# Patient Record
Sex: Female | Born: 1935 | Hispanic: No | Smoking: Never smoker
Health system: Southern US, Community
[De-identification: ages and names within clinical notes are randomized; demographics above are authoritative.]

## PROBLEM LIST (undated history)

## (undated) DIAGNOSIS — E079 Disorder of thyroid, unspecified: Secondary | ICD-10-CM

## (undated) DIAGNOSIS — I10 Essential (primary) hypertension: Secondary | ICD-10-CM

## (undated) DIAGNOSIS — M069 Rheumatoid arthritis, unspecified: Secondary | ICD-10-CM

## (undated) DIAGNOSIS — C801 Malignant (primary) neoplasm, unspecified: Secondary | ICD-10-CM

## (undated) DIAGNOSIS — M810 Age-related osteoporosis without current pathological fracture: Secondary | ICD-10-CM

## (undated) HISTORY — DX: Disorder of thyroid, unspecified: E07.9

## (undated) HISTORY — DX: Malignant (primary) neoplasm, unspecified: C80.1

## (undated) HISTORY — DX: Essential (primary) hypertension: I10

## (undated) HISTORY — DX: Age-related osteoporosis without current pathological fracture: M81.0

## (undated) HISTORY — DX: Rheumatoid arthritis, unspecified: M06.9

## (undated) HISTORY — PX: OTHER SURGICAL HISTORY: SHX169

## (undated) HISTORY — PX: ABDOMINAL HYSTERECTOMY: SHX81

---

## 2000-09-12 ENCOUNTER — Other Ambulatory Visit: Admission: RE | Admit: 2000-09-12 | Discharge: 2000-09-12 | Payer: Self-pay | Admitting: Family Medicine

## 2004-03-23 ENCOUNTER — Other Ambulatory Visit: Admission: RE | Admit: 2004-03-23 | Discharge: 2004-03-23 | Payer: Self-pay | Admitting: Family Medicine

## 2012-11-04 ENCOUNTER — Ambulatory Visit: Payer: Self-pay | Admitting: Family Medicine

## 2013-07-16 DIAGNOSIS — M069 Rheumatoid arthritis, unspecified: Secondary | ICD-10-CM

## 2013-07-16 HISTORY — DX: Rheumatoid arthritis, unspecified: M06.9

## 2013-10-06 ENCOUNTER — Ambulatory Visit (INDEPENDENT_AMBULATORY_CARE_PROVIDER_SITE_OTHER): Payer: Medicare Other | Admitting: General Practice

## 2013-10-06 ENCOUNTER — Encounter (INDEPENDENT_AMBULATORY_CARE_PROVIDER_SITE_OTHER): Payer: Self-pay

## 2013-10-06 ENCOUNTER — Encounter: Payer: Self-pay | Admitting: General Practice

## 2013-10-06 VITALS — BP 172/86 | HR 87 | Temp 97.6°F | Ht 59.5 in | Wt 124.8 lb

## 2013-10-06 DIAGNOSIS — R350 Frequency of micturition: Secondary | ICD-10-CM

## 2013-10-06 DIAGNOSIS — I10 Essential (primary) hypertension: Secondary | ICD-10-CM

## 2013-10-06 DIAGNOSIS — IMO0001 Reserved for inherently not codable concepts without codable children: Secondary | ICD-10-CM

## 2013-10-06 DIAGNOSIS — Z0000 Encounter for general adult medical examination without abnormal findings: Secondary | ICD-10-CM

## 2013-10-06 DIAGNOSIS — E039 Hypothyroidism, unspecified: Secondary | ICD-10-CM

## 2013-10-06 LAB — POCT URINALYSIS DIPSTICK
Bilirubin, UA: NEGATIVE
Blood, UA: NEGATIVE
Glucose, UA: NEGATIVE
Ketones, UA: NEGATIVE
LEUKOCYTES UA: NEGATIVE
NITRITE UA: NEGATIVE
PH UA: 6.5
Protein, UA: NEGATIVE
Spec Grav, UA: 1.01
Urobilinogen, UA: NEGATIVE

## 2013-10-06 LAB — POCT CBC
GRANULOCYTE PERCENT: 56 % (ref 37–80)
HEMATOCRIT: 41 % (ref 37.7–47.9)
Hemoglobin: 12.8 g/dL (ref 12.2–16.2)
LYMPH, POC: 2.5 (ref 0.6–3.4)
MCH, POC: 29.6 pg (ref 27–31.2)
MCHC: 31.3 g/dL — AB (ref 31.8–35.4)
MCV: 94.7 fL (ref 80–97)
MPV: 8.8 fL (ref 0–99.8)
POC Granulocyte: 3.5 (ref 2–6.9)
POC LYMPH %: 40.2 % (ref 10–50)
Platelet Count, POC: 222 10*3/uL (ref 142–424)
RBC: 4.3 M/uL (ref 4.04–5.48)
RDW, POC: 14 %
WBC: 6.2 10*3/uL (ref 4.6–10.2)

## 2013-10-06 LAB — POCT UA - MICROSCOPIC ONLY
Bacteria, U Microscopic: NEGATIVE
CASTS, UR, LPF, POC: NEGATIVE
Crystals, Ur, HPF, POC: NEGATIVE
MUCUS UA: NEGATIVE
RBC, urine, microscopic: NEGATIVE
Yeast, UA: NEGATIVE

## 2013-10-06 MED ORDER — LISINOPRIL 10 MG PO TABS
10.0000 mg | ORAL_TABLET | Freq: Every day | ORAL | Status: DC
Start: 2013-10-06 — End: 2013-10-14

## 2013-10-06 NOTE — Progress Notes (Signed)
   Subjective:    Patient ID: Ashley Nelson, female    DOB: 11-May-1933, 80 y.o.   MRN: [id number]  Urinary Frequency  This is a new problem. The current episode started in the past 7 days. The problem occurs every urination. The problem has been gradually worsening. There has been no fever. Associated symptoms include frequency and urgency. Pertinent negatives include no chills, flank pain, hematuria, hesitancy, nausea or vomiting. She has tried nothing for the symptoms. Her past medical history is significant for recurrent UTIs.      Review of Systems  Constitutional: Negative for fever and chills.  Respiratory: Negative for chest tightness and shortness of breath.   Cardiovascular: Negative for chest pain and palpitations.  Gastrointestinal: Negative for nausea and vomiting.  Genitourinary: Positive for urgency and frequency. Negative for dysuria, hesitancy, hematuria, flank pain and difficulty urinating.  Neurological: Negative for dizziness, weakness and headaches.  All other systems reviewed and are negative.       Objective:   Physical Exam  Constitutional: She is oriented to person, place, and time. She appears well-developed and well-nourished.  HENT:  Head: Normocephalic and atraumatic.  Right Ear: External ear normal.  Left Ear: External ear normal.  Eyes: EOM are normal. Pupils are equal, round, and reactive to light.  Neck: Normal range of motion. Neck supple. No thyromegaly present.  Cardiovascular: Normal rate, regular rhythm and normal heart sounds.   Pulmonary/Chest: Effort normal and breath sounds normal. No respiratory distress. She exhibits no tenderness.  Abdominal: She exhibits no distension. There is no tenderness.  Lymphadenopathy:    She has no cervical adenopathy.  Neurological: She is alert and oriented to person, place, and time.  Skin: Skin is warm and dry.  Psychiatric: She has a normal mood and affect.      Results for orders placed in visit on  10/06/13  POCT URINALYSIS DIPSTICK      Result Value Ref Range   Color, UA yellow     Clarity, UA clear     Glucose, UA neg     Bilirubin, UA neg     Ketones, UA neg     Spec Grav, UA 1.010     Blood, UA neg     pH, UA 6.5     Protein, UA neg     Urobilinogen, UA negative     Nitrite, UA neg     Leukocytes, UA Negative    POCT UA - MICROSCOPIC ONLY      Result Value Ref Range   WBC, Ur, HPF, POC rare     RBC, urine, microscopic neg     Bacteria, U Microscopic neg     Mucus, UA neg     Epithelial cells, urine per micros occ     Crystals, Ur, HPF, POC neg     Casts, Ur, LPF, POC neg     Yeast, UA neg         Assessment & Plan:  1. Frequency  - POCT urinalysis dipstick - POCT UA - Microscopic Only  2. Annual physical exam  - POCT CBC  3. Hypertension  - CMP14+EGFR - Lipid panel - lisinopril (PRINIVIL,ZESTRIL) 10 MG tablet; Take 1 tablet (10 mg total) by mouth daily.  Dispense: 30 tablet; Refill: 0  4. Hypothyroidism Continue all current medications Labs pending F/u in 3 months Discussed benefits of regular exercise and healthy eating Patient verbalized understanding Erby Pian, FNP-C

## 2013-10-06 NOTE — Patient Instructions (Signed)

## 2013-10-07 LAB — LIPID PANEL
Chol/HDL Ratio: 2.8 ratio (ref 0.0–4.4)
Cholesterol, Total: 263 mg/dL — ABNORMAL HIGH (ref 100–199)
HDL: 93 mg/dL
LDL Calculated: 144 mg/dL — ABNORMAL HIGH (ref 0–99)
Triglycerides: 129 mg/dL (ref 0–149)
VLDL Cholesterol Cal: 26 mg/dL (ref 5–40)

## 2013-10-07 LAB — CMP14+EGFR
ALT: 19 IU/L (ref 0–32)
AST: 27 IU/L (ref 0–40)
Albumin/Globulin Ratio: 1.4 (ref 1.1–2.5)
Albumin: 4.4 g/dL (ref 3.5–4.8)
Alkaline Phosphatase: 69 IU/L (ref 39–117)
BUN/Creatinine Ratio: 17 (ref 11–26)
BUN: 12 mg/dL (ref 8–27)
CO2: 28 mmol/L (ref 18–29)
Calcium: 9.2 mg/dL (ref 8.7–10.3)
Chloride: 99 mmol/L (ref 97–108)
Creatinine, Ser: 0.72 mg/dL (ref 0.57–1.00)
GFR calc Af Amer: 93 mL/min/1.73
GFR calc non Af Amer: 80 mL/min/1.73
Globulin, Total: 3.1 g/dL (ref 1.5–4.5)
Glucose: 79 mg/dL (ref 65–99)
Potassium: 4.2 mmol/L (ref 3.5–5.2)
Sodium: 143 mmol/L (ref 134–144)
Total Bilirubin: 0.4 mg/dL (ref 0.0–1.2)
Total Protein: 7.5 g/dL (ref 6.0–8.5)

## 2013-10-13 ENCOUNTER — Other Ambulatory Visit: Payer: Medicare Other

## 2013-10-14 ENCOUNTER — Ambulatory Visit: Payer: Medicare Other | Admitting: *Deleted

## 2013-10-14 VITALS — BP 150/65 | HR 79

## 2013-10-14 DIAGNOSIS — I10 Essential (primary) hypertension: Secondary | ICD-10-CM

## 2013-10-14 MED ORDER — LISINOPRIL 10 MG PO TABS
10.0000 mg | ORAL_TABLET | Freq: Every day | ORAL | Status: DC
Start: 2013-10-14 — End: 2014-06-16

## 2013-10-14 NOTE — Progress Notes (Signed)
Ashley Nelson, pt bp taken today in office She last took lisinopril 10 mg last night She states the med makes her dizzy since starting 5 days ago Please advise - she is going out of town tomorrow night for 4 days.

## 2014-06-15 ENCOUNTER — Telehealth: Payer: Self-pay | Admitting: Family Medicine

## 2014-06-15 NOTE — Telephone Encounter (Signed)
appt made

## 2014-06-16 ENCOUNTER — Ambulatory Visit (INDEPENDENT_AMBULATORY_CARE_PROVIDER_SITE_OTHER): Payer: Medicare Other | Admitting: Family Medicine

## 2014-06-16 ENCOUNTER — Encounter: Payer: Self-pay | Admitting: Family Medicine

## 2014-06-16 VITALS — BP 152/92 | HR 90 | Temp 97.8°F | Ht 59.5 in | Wt 123.6 lb

## 2014-06-16 DIAGNOSIS — K2100 Gastro-esophageal reflux disease with esophagitis, without bleeding: Secondary | ICD-10-CM

## 2014-06-16 DIAGNOSIS — J069 Acute upper respiratory infection, unspecified: Secondary | ICD-10-CM

## 2014-06-16 MED ORDER — OMEPRAZOLE 20 MG PO CPDR: 20.0000 mg | DELAYED_RELEASE_CAPSULE | Freq: Every day | ORAL | Status: DC

## 2014-06-16 MED ORDER — AZITHROMYCIN 250 MG PO TABS: ORAL_TABLET | ORAL | Status: DC

## 2014-06-16 NOTE — Progress Notes (Signed)
   Subjective:    Patient ID: Ashley Nelson, female    DOB: 1933-05-22, 80 y.o.   MRN: [id number]  HPI Paient is here for c/o cough, congestion, and GERD sx's.  Review of Systems  Constitutional: Negative for fever.  HENT: Negative for ear pain.   Eyes: Negative for discharge.  Respiratory: Negative for cough.   Cardiovascular: Negative for chest pain.  Gastrointestinal: Negative for abdominal distention.  Endocrine: Negative for polyuria.  Genitourinary: Negative for difficulty urinating.  Musculoskeletal: Negative for gait problem and neck pain.  Skin: Negative for color change and rash.  Neurological: Negative for speech difficulty and headaches.  Psychiatric/Behavioral: Negative for agitation.       Objective:    BP 152/92 mmHg  Pulse 90  Temp(Src) 97.8 F (36.6 C) (Oral)  Ht 4' 11.5" (1.511 m)  Wt 123 lb 9.6 oz (56.065 kg)  BMI 24.56 kg/m2 Physical Exam  Constitutional: She is oriented to person, place, and time. She appears well-developed and well-nourished.  HENT:  Head: Normocephalic and atraumatic.  Mouth/Throat: Oropharynx is clear and moist.  Eyes: Pupils are equal, round, and reactive to light.  Neck: Normal range of motion. Neck supple.  Cardiovascular: Normal rate and regular rhythm.   No murmur heard. Pulmonary/Chest: Effort normal and breath sounds normal.  Abdominal: Soft. Bowel sounds are normal. There is no tenderness.  Neurological: She is alert and oriented to person, place, and time.  Skin: Skin is warm and dry.  Psychiatric: She has a normal mood and affect.          Assessment & Plan:     ICD-9-CM ICD-10-CM   1. Gastroesophageal reflux disease with esophagitis 530.11 K21.0 omeprazole (PRILOSEC) 20 MG capsule  2. URI (upper respiratory infection) 465.9 J06.9 azithromycin (ZITHROMAX) 250 MG tablet     No Follow-up on file.  Lysbeth Penner FNP

## 2014-07-26 ENCOUNTER — Ambulatory Visit (INDEPENDENT_AMBULATORY_CARE_PROVIDER_SITE_OTHER): Payer: Medicare Other | Admitting: Family

## 2014-07-26 ENCOUNTER — Encounter: Payer: Self-pay | Admitting: Family

## 2014-07-26 VITALS — BP 160/94 | HR 88 | Temp 97.9°F | Ht 59.5 in | Wt 120.0 lb

## 2014-07-26 DIAGNOSIS — J069 Acute upper respiratory infection, unspecified: Secondary | ICD-10-CM | POA: Diagnosis not present

## 2014-07-26 DIAGNOSIS — M25511 Pain in right shoulder: Secondary | ICD-10-CM

## 2014-07-26 DIAGNOSIS — R05 Cough: Secondary | ICD-10-CM | POA: Diagnosis not present

## 2014-07-26 DIAGNOSIS — R059 Cough, unspecified: Secondary | ICD-10-CM

## 2014-07-26 MED ORDER — BENZONATATE 200 MG PO CAPS: 200.0000 mg | ORAL_CAPSULE | Freq: Three times a day (TID) | ORAL | Status: DC | PRN

## 2014-07-26 MED ORDER — AMOXICILLIN-POT CLAVULANATE 875-125 MG PO TABS: 1.0000 | ORAL_TABLET | Freq: Two times a day (BID) | ORAL | Status: DC

## 2014-07-26 NOTE — Patient Instructions (Signed)
Upper Respiratory Infection, Adult An upper respiratory infection (URI) is also sometimes known as the common cold. The upper respiratory tract includes the nose, sinuses, throat, trachea, and bronchi. Bronchi are the airways leading to the lungs. Most people improve within 1 week, but symptoms can last up to 2 weeks. A residual cough may last even longer.  CAUSES Many different viruses can infect the tissues lining the upper respiratory tract. The tissues become irritated and inflamed and often become very moist. Mucus production is also common. A cold is contagious. You can easily spread the virus to others by oral contact. This includes kissing, sharing a glass, coughing, or sneezing. Touching your mouth or nose and then touching a surface, which is then touched by another person, can also spread the virus. SYMPTOMS  Symptoms typically develop 1 to 3 days after you come in contact with a cold virus. Symptoms vary from person to person. They may include:  Runny nose.  Sneezing.  Nasal congestion.  Sinus irritation.  Sore throat.  Loss of voice (laryngitis).  Cough.  Fatigue.  Muscle aches.  Loss of appetite.  Headache.  Low-grade fever. DIAGNOSIS  You might diagnose your own cold based on familiar symptoms, since most people get a cold 2 to 3 times a year. Your caregiver can confirm this based on your exam. Most importantly, your caregiver can check that your symptoms are not due to another disease such as strep throat, sinusitis, pneumonia, asthma, or epiglottitis. Blood tests, throat tests, and X-rays are not necessary to diagnose a common cold, but they may sometimes be helpful in excluding other more serious diseases. Your caregiver will decide if any further tests are required. RISKS AND COMPLICATIONS  You may be at risk for a more severe case of the common cold if you smoke cigarettes, have chronic heart disease (such as heart failure) or lung disease (such as asthma), or if  you have a weakened immune system. The very young and very old are also at risk for more serious infections. Bacterial sinusitis, middle ear infections, and bacterial pneumonia can complicate the common cold. The common cold can worsen asthma and chronic obstructive pulmonary disease (COPD). Sometimes, these complications can require emergency medical care and may be life-threatening. PREVENTION  The best way to protect against getting a cold is to practice good hygiene. Avoid oral or hand contact with people with cold symptoms. Wash your hands often if contact occurs. There is no clear evidence that vitamin C, vitamin E, echinacea, or exercise reduces the chance of developing a cold. However, it is always recommended to get plenty of rest and practice good nutrition. TREATMENT  Treatment is directed at relieving symptoms. There is no cure. Antibiotics are not effective, because the infection is caused by a virus, not by bacteria. Treatment may include:  Increased fluid intake. Sports drinks offer valuable electrolytes, sugars, and fluids.  Breathing heated mist or steam (vaporizer or shower).  Eating chicken soup or other clear broths, and maintaining good nutrition.  Getting plenty of rest.  Using gargles or lozenges for comfort.  Controlling fevers with ibuprofen or acetaminophen as directed by your caregiver.  Increasing usage of your inhaler if you have asthma. Zinc gel and zinc lozenges, taken in the first 24 hours of the common cold, can shorten the duration and lessen the severity of symptoms. Pain medicines may help with fever, muscle aches, and throat pain. A variety of non-prescription medicines are available to treat congestion and runny nose. Your caregiver   can make recommendations and may suggest nasal or lung inhalers for other symptoms.  HOME CARE INSTRUCTIONS   Only take over-the-counter or prescription medicines for pain, discomfort, or fever as directed by your  caregiver.  Use a warm mist humidifier or inhale steam from a shower to increase air moisture. This may keep secretions moist and make it easier to breathe.  Drink enough water and fluids to keep your urine clear or pale yellow.  Rest as needed.  Return to work when your temperature has returned to normal or as your caregiver advises. You may need to stay home longer to avoid infecting others. You can also use a face mask and careful hand washing to prevent spread of the virus. SEEK MEDICAL CARE IF:   After the first few days, you feel you are getting worse rather than better.  You need your caregiver's advice about medicines to control symptoms.  You develop chills, worsening shortness of breath, or brown or red sputum. These may be signs of pneumonia.  You develop yellow or brown nasal discharge or pain in the face, especially when you bend forward. These may be signs of sinusitis.  You develop a fever, swollen neck glands, pain with swallowing, or white areas in the back of your throat. These may be signs of strep throat. SEEK IMMEDIATE MEDICAL CARE IF:   You have a fever.  You develop severe or persistent headache, ear pain, sinus pain, or chest pain.  You develop wheezing, a prolonged cough, cough up blood, or have a change in your usual mucus (if you have chronic lung disease).  You develop sore muscles or a stiff neck. Document Released: 12/26/2000 Document Revised: 09/24/2011 Document Reviewed: 10/07/2013 [facility] Patient Information 2015 [place], [place]. This information is not intended to replace advice given to you by your health care provider. Make sure you discuss any questions you have with your health care provider.  - Take meds as prescribed - Use a cool mist humidifier  -Use saline nose sprays frequently -Saline irrigations of the nose can be very helpful if done frequently.  * 4X daily for 1 week*  * Use of a nettie pot can be helpful with this. Follow  directions with this* -Force fluids -For any cough or congestion  Use plain Mucinex- regular strength or max strength is fine   * Children- consult with Pharmacist for dosing -For fever or aces or pains- take tylenol or ibuprofen appropriate for age and weight.  * for fevers greater than 101 orally you may alternate ibuprofen and tylenol every  3 hours. -Throat lozenges if help -New toothbrush in 3 days   [name], FNP

## 2014-07-26 NOTE — Progress Notes (Signed)
Subjective:    Patient ID: [name], female    DOB: [birth year], 81 y.o.   MRN: [id number]  Cough This is a recurrent problem. The current episode started more than 1 month ago. The problem has been waxing and waning. The problem occurs every few minutes. The cough is productive of purulent sputum. Associated symptoms include myalgias, nasal congestion, postnasal drip, rhinorrhea and shortness of breath. Pertinent negatives include no chills, ear congestion, ear pain, fever, headaches, sore throat or wheezing. The symptoms are aggravated by lying down and exercise. She has tried rest (ZpaK) for the symptoms. The treatment provided mild relief. There is no history of asthma, COPD or pneumonia.  Shoulder Pain  The pain is present in the right shoulder. This is a new problem. The current episode started today. There has been no history of extremity trauma. The problem occurs constantly. The problem has been waxing and waning. The quality of the pain is described as aching. The pain is at a severity of 8/10. The pain is mild. Pertinent negatives include no fever. She has tried nothing for the symptoms. The treatment provided no relief.      Review of Systems  Constitutional: Negative.  Negative for fever and chills.  HENT: Positive for postnasal drip and rhinorrhea. Negative for ear pain and sore throat.   Eyes: Negative.   Respiratory: Positive for cough and shortness of breath. Negative for wheezing.   Cardiovascular: Negative.  Negative for palpitations.  Gastrointestinal: Negative.   Endocrine: Negative.   Genitourinary: Negative.   Musculoskeletal: Positive for myalgias.  Neurological: Negative.  Negative for headaches.  Hematological: Negative.   Psychiatric/Behavioral: Negative.   All other systems reviewed and are negative.      Objective:   Physical Exam  Constitutional: She is oriented to person, place, and time. She appears well-developed and well-nourished. No distress.    HENT:  Head: Normocephalic and atraumatic.  Right Ear: External ear normal.  Left Ear: External ear normal.  Nasal passage erythema  Eyes: Pupils are equal, round, and reactive to light.  Neck: Normal range of motion. Neck supple. No thyromegaly present.  Cardiovascular: Normal rate, regular rhythm, normal heart sounds and intact distal pulses.   No murmur heard. Pulmonary/Chest: Effort normal. No respiratory distress. She has wheezes.  Coarse breath sounds bilaterally   Abdominal: Soft. Bowel sounds are normal. She exhibits no distension. There is no tenderness.  Musculoskeletal: Normal range of motion. She exhibits no edema or tenderness.  Neurological: She is alert and oriented to person, place, and time. She has normal reflexes. No cranial nerve deficit.  Skin: Skin is warm and dry.  Psychiatric: She has a normal mood and affect. Her behavior is normal. Judgment and thought content normal.  Vitals reviewed.     BP 160/94 mmHg  Pulse 88  Temp(Src) 97.9 F (36.6 C) (Oral)  Ht 4' 11.5" (1.511 m)  Wt 120 lb (54.432 kg)  BMI 23.84 kg/m2     Assessment & Plan:  1. Acute upper respiratory infection - amoxicillin-clavulanate (AUGMENTIN) 875-125 MG per tablet; Take 1 tablet by mouth 2 (two) times daily.  Dispense: 14 tablet; Refill: 0  2. Cough - benzonatate (TESSALON) 200 MG capsule; Take 1 capsule (200 mg total) by mouth 3 (three) times daily as needed.  Dispense: 30 capsule; Refill: 1  3. Right shoulder pain  - Take meds as prescribed - Use a cool mist humidifier  -Use saline nose sprays frequently -Saline irrigations of the nose can be  very helpful if done frequently.  * 4X daily for 1 week*  * Use of a nettie pot can be helpful with this. Follow directions with this* -Force fluids -For any cough or congestion  Use plain Mucinex- regular strength or max strength is fine   * Children- consult with Pharmacist for dosing -For fever or aces or pains- take tylenol or  ibuprofen appropriate for age and weight.  * for fevers greater than 101 orally you may alternate ibuprofen and tylenol every  3 hours. -Throat lozenges if help -New toothbrush in 3 days   [name], FNP

## 2014-09-24 ENCOUNTER — Encounter: Payer: Self-pay | Admitting: Family

## 2014-09-24 ENCOUNTER — Ambulatory Visit (INDEPENDENT_AMBULATORY_CARE_PROVIDER_SITE_OTHER): Payer: Medicare Other

## 2014-09-24 ENCOUNTER — Ambulatory Visit (INDEPENDENT_AMBULATORY_CARE_PROVIDER_SITE_OTHER): Payer: Medicare Other | Admitting: Family

## 2014-09-24 VITALS — BP 167/84 | HR 76 | Temp 97.2°F | Ht 59.5 in | Wt 120.4 lb

## 2014-09-24 DIAGNOSIS — K219 Gastro-esophageal reflux disease without esophagitis: Secondary | ICD-10-CM

## 2014-09-24 DIAGNOSIS — E039 Hypothyroidism, unspecified: Secondary | ICD-10-CM | POA: Diagnosis not present

## 2014-09-24 DIAGNOSIS — I10 Essential (primary) hypertension: Secondary | ICD-10-CM

## 2014-09-24 DIAGNOSIS — M25531 Pain in right wrist: Secondary | ICD-10-CM

## 2014-09-24 DIAGNOSIS — Z1321 Encounter for screening for nutritional disorder: Secondary | ICD-10-CM | POA: Diagnosis not present

## 2014-09-24 IMAGING — CR DG WRIST COMPLETE 3+V*R*
3 series · 3 of 3 positions shown · non-contrast
Comparison: None.

CLINICAL DATA: Right wrist pain.  Swelling.

EXAM:
RIGHT WRIST - COMPLETE 3+ VIEW

[view not recorded (1 of 3)]
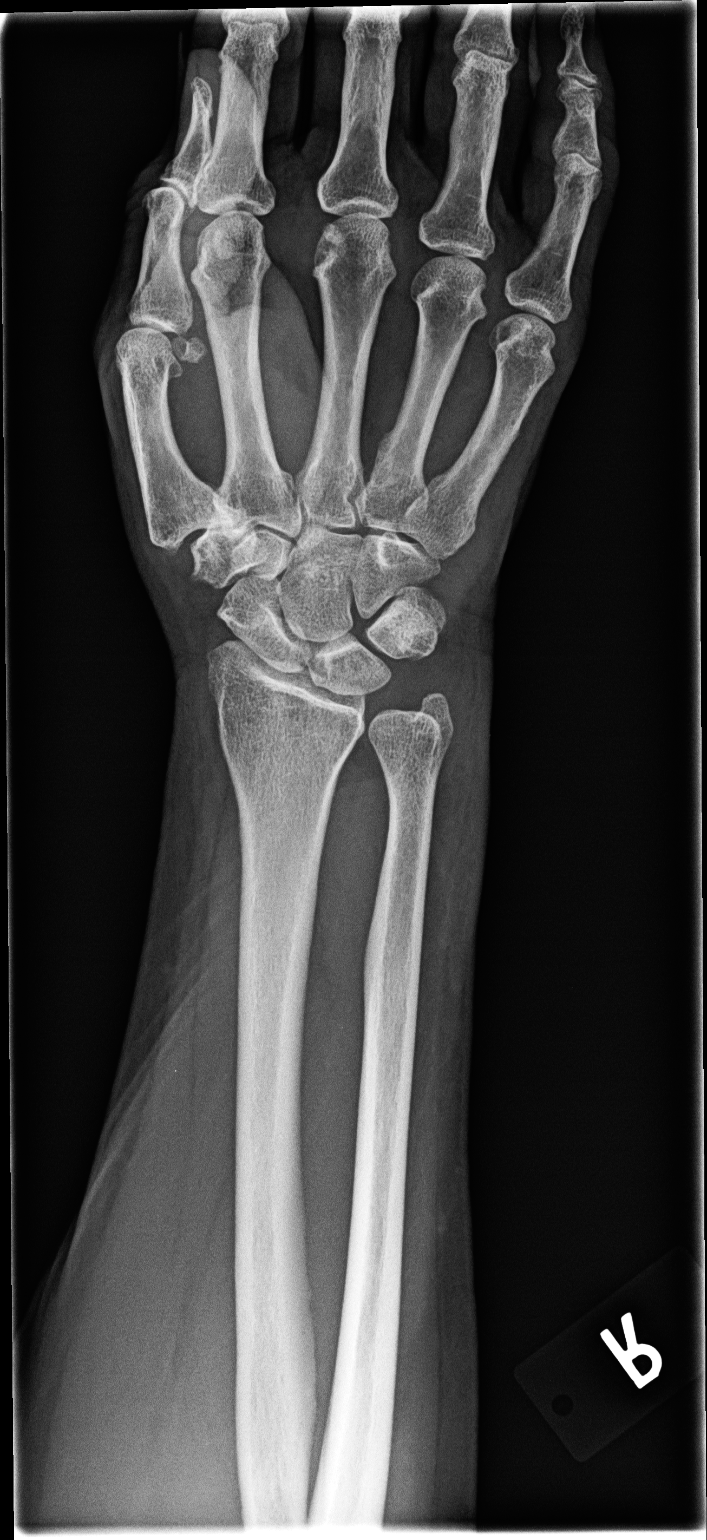

[view not recorded (2 of 3)]
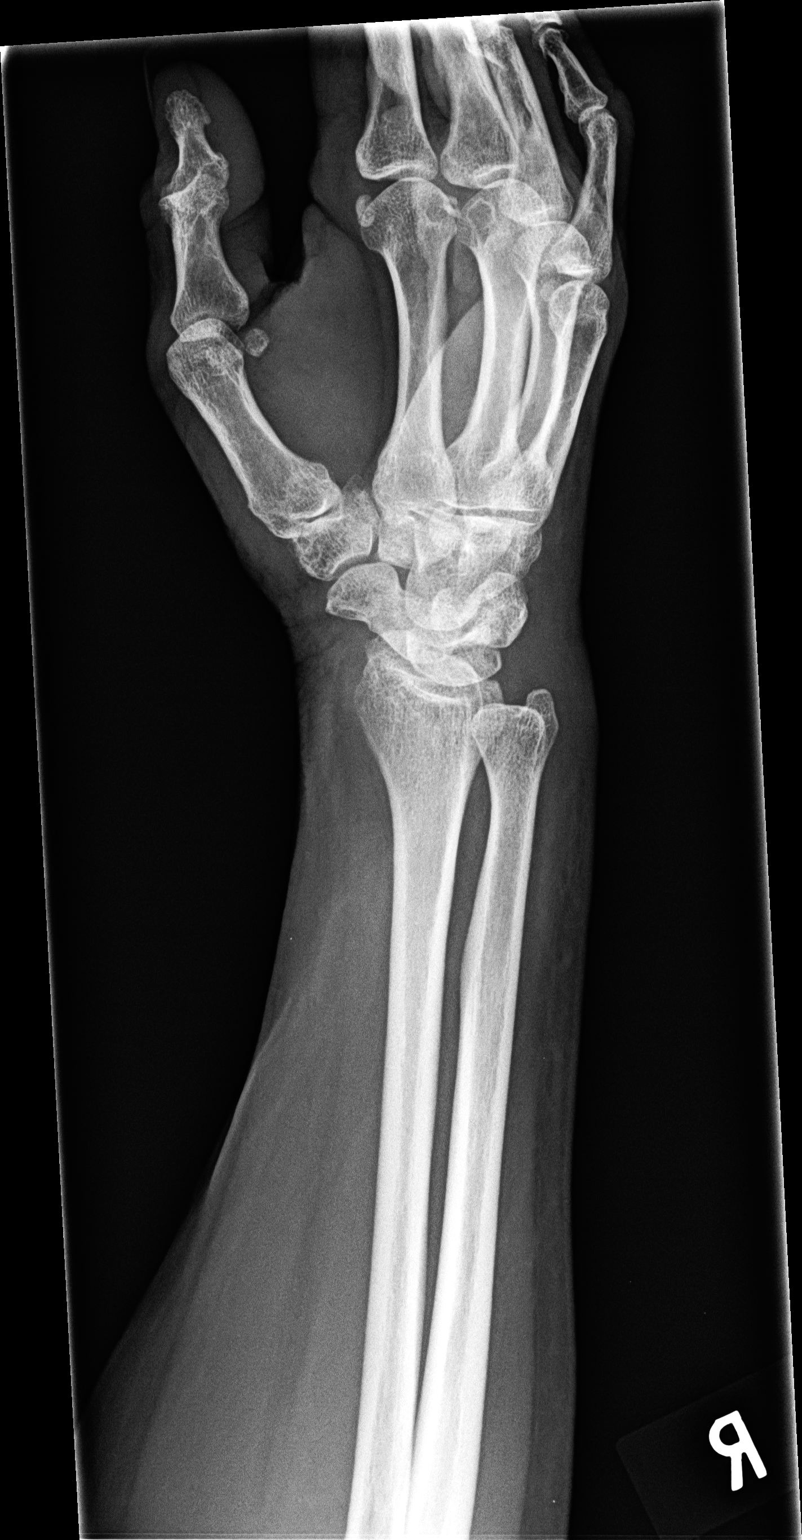

[view not recorded (3 of 3)]
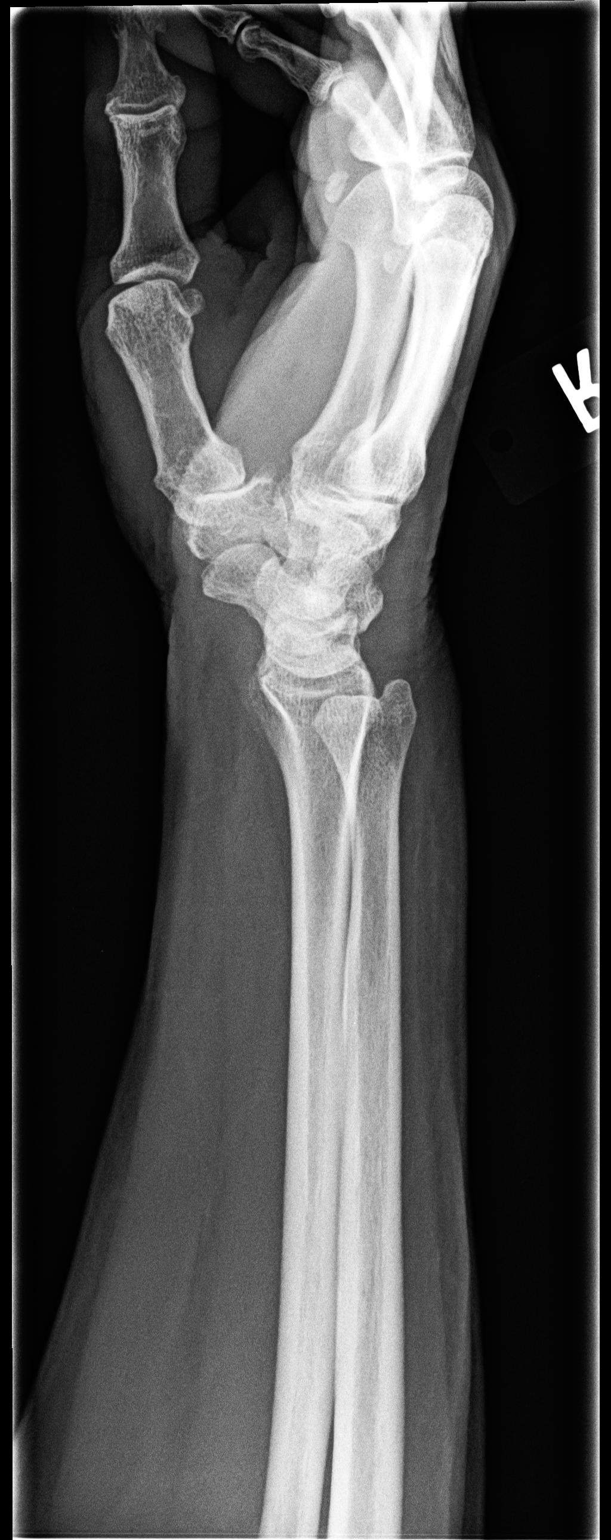

[3 of 3 positions shown; findings below may reference images not displayed]

FINDINGS: Spurring at the first carpometacarpal articulation. No discrete
fracture or acute bony findings.
IMPRESSION: 1. Spurring at the first carpometacarpal junction.

## 2014-09-24 MED ORDER — LISINOPRIL 20 MG PO TABS: 20.0000 mg | ORAL_TABLET | Freq: Every day | ORAL | Status: DC

## 2014-09-24 NOTE — Patient Instructions (Signed)
Gout Gout is an inflammatory arthritis caused by a buildup of uric acid crystals in the joints. Uric acid is a chemical that is normally present in the blood. When the level of uric acid in the blood is too high it can form crystals that deposit in your joints and tissues. This causes joint redness, soreness, and swelling (inflammation). Repeat attacks are common. Over time, uric acid crystals can form into masses (tophi) near a joint, destroying bone and causing disfigurement. Gout is treatable and often preventable. CAUSES  The disease begins with elevated levels of uric acid in the blood. Uric acid is produced by your body when it breaks down a naturally found substance called purines. Certain foods you eat, such as meats and fish, contain high amounts of purines. Causes of an elevated uric acid level include:  Being passed down from parent to child (heredity).  Diseases that cause increased uric acid production (such as obesity, psoriasis, and certain cancers).  Excessive alcohol use.  Diet, especially diets rich in meat and seafood.  Medicines, including certain cancer-fighting medicines (chemotherapy), water pills (diuretics), and aspirin.  Chronic kidney disease. The kidneys are no longer able to remove uric acid well.  Problems with metabolism. Conditions strongly associated with gout include:  Obesity.  High blood pressure.  High cholesterol.  Diabetes. Not everyone with elevated uric acid levels gets gout. It is not understood why some people get gout and others do not. Surgery, joint injury, and eating too much of certain foods are some of the factors that can lead to gout attacks. SYMPTOMS   An attack of gout comes on quickly. It causes intense pain with redness, swelling, and warmth in a joint.  Fever can occur.  Often, only one joint is involved. Certain joints are more commonly involved:  Base of the big toe.  Knee.  Ankle.  Wrist.  Finger. Without  treatment, an attack usually goes away in a few days to weeks. Between attacks, you usually will not have symptoms, which is different from many other forms of arthritis. DIAGNOSIS  Your caregiver will suspect gout based on your symptoms and exam. In some cases, tests may be recommended. The tests may include:  Blood tests.  Urine tests.  X-rays.  Joint fluid exam. This exam requires a needle to remove fluid from the joint (arthrocentesis). Using a microscope, gout is confirmed when uric acid crystals are seen in the joint fluid. TREATMENT  There are two phases to gout treatment: treating the sudden onset (acute) attack and preventing attacks (prophylaxis).  Treatment of an Acute Attack.  Medicines are used. These include anti-inflammatory medicines or steroid medicines.  An injection of steroid medicine into the affected joint is sometimes necessary.  The painful joint is rested. Movement can worsen the arthritis.  You may use warm or cold treatments on painful joints, depending which works best for you.  Treatment to Prevent Attacks.  If you suffer from frequent gout attacks, your caregiver may advise preventive medicine. These medicines are started after the acute attack subsides. These medicines either help your kidneys eliminate uric acid from your body or decrease your uric acid production. You may need to stay on these medicines for a very long time.  The early phase of treatment with preventive medicine can be associated with an increase in acute gout attacks. For this reason, during the first few months of treatment, your caregiver may also advise you to take medicines usually used for acute gout treatment. Be sure you   understand your caregiver's directions. Your caregiver may make several adjustments to your medicine dose before these medicines are effective.  Discuss dietary treatment with your caregiver or dietitian. Alcohol and drinks high in sugar and fructose and foods  such as meat, poultry, and seafood can increase uric acid levels. Your caregiver or dietitian can advise you on drinks and foods that should be limited. HOME CARE INSTRUCTIONS   Do not take aspirin to relieve pain. This raises uric acid levels.  Only take over-the-counter or prescription medicines for pain, discomfort, or fever as directed by your caregiver.  Rest the joint as much as possible. When in bed, keep sheets and blankets off painful areas.  Keep the affected joint raised (elevated).  Apply warm or cold treatments to painful joints. Use of warm or cold treatments depends on which works best for you.  Use crutches if the painful joint is in your leg.  Drink enough fluids to keep your urine clear or pale yellow. This helps your body get rid of uric acid. Limit alcohol, sugary drinks, and fructose drinks.  Follow your dietary instructions. Pay careful attention to the amount of protein you eat. Your daily diet should emphasize fruits, vegetables, whole grains, and fat-free or low-fat milk products. Discuss the use of coffee, vitamin C, and cherries with your caregiver or dietitian. These may be helpful in lowering uric acid levels.  Maintain a healthy body weight. SEEK MEDICAL CARE IF:   You develop diarrhea, vomiting, or any side effects from medicines.  You do not feel better in 24 hours, or you are getting worse. SEEK IMMEDIATE MEDICAL CARE IF:   Your joint becomes suddenly more tender, and you have chills or a fever. MAKE SURE YOU:   Understand these instructions.  Will watch your condition.  Will get help right away if you are not doing well or get worse. Document Released: 06/29/2000 Document Revised: 11/16/2013 Document Reviewed: 02/13/2012 [facility] Patient Information 2015 [place], [place]. This information is not intended to replace advice given to you by your health care provider. Make sure you discuss any questions you have with your health care provider. DASH  Eating Plan DASH stands for "Dietary Approaches to Stop Hypertension." The DASH eating plan is a healthy eating plan that has been shown to reduce high blood pressure (hypertension). Additional health benefits may include reducing the risk of type 2 diabetes mellitus, heart disease, and stroke. The DASH eating plan may also help with weight loss. WHAT DO I NEED TO KNOW ABOUT THE DASH EATING PLAN? For the DASH eating plan, you will follow these general guidelines:  Choose foods with a percent daily value for sodium of less than 5% (as listed on the food label).  Use salt-free seasonings or herbs instead of table salt or sea salt.  Check with your health care provider or pharmacist before using salt substitutes.  Eat lower-sodium products, often labeled as "lower sodium" or "no salt added."  Eat fresh foods.  Eat more vegetables, fruits, and low-fat dairy products.  Choose whole grains. Look for the word "whole" as the first word in the ingredient list.  Choose fish and skinless chicken or [place] more often than red meat. Limit fish, poultry, and meat to 6 oz (170 g) each day.  Limit sweets, desserts, sugars, and sugary drinks.  Choose heart-healthy fats.  Limit cheese to 1 oz (28 g) per day.  Eat more home-cooked food and less restaurant, buffet, and fast food.  Limit fried foods.  Lacinda Axon  foods using methods other than frying.  Limit canned vegetables. If you do use them, rinse them well to decrease the sodium.  When eating at a restaurant, ask that your food be prepared with less salt, or no salt if possible. WHAT FOODS CAN I EAT? Seek help from a dietitian for individual calorie needs. Grains Whole grain or whole wheat bread. Brown rice. Whole grain or whole wheat pasta. Quinoa, bulgur, and whole grain cereals. Low-sodium cereals. Corn or whole wheat flour tortillas. Whole grain cornbread. Whole grain crackers. Low-sodium crackers. Vegetables Fresh or frozen vegetables (raw,  steamed, roasted, or grilled). Low-sodium or reduced-sodium tomato and vegetable juices. Low-sodium or reduced-sodium tomato sauce and paste. Low-sodium or reduced-sodium canned vegetables.  Fruits All fresh, canned (in natural juice), or frozen fruits. Meat and Other Protein Products Ground beef (85% or leaner), grass-fed beef, or beef trimmed of fat. Skinless chicken or [place]. Ground chicken or [place]. Pork trimmed of fat. All fish and seafood. Eggs. Dried beans, peas, or lentils. Unsalted nuts and seeds. Unsalted canned beans. Dairy Low-fat dairy products, such as skim or 1% milk, 2% or reduced-fat cheeses, low-fat ricotta or cottage cheese, or plain low-fat yogurt. Low-sodium or reduced-sodium cheeses. Fats and Oils Tub margarines without trans fats. Light or reduced-fat mayonnaise and salad dressings (reduced sodium). Avocado. Safflower, olive, or canola oils. Natural peanut or almond butter. Other Unsalted popcorn and pretzels. The items listed above may not be a complete list of recommended foods or beverages. Contact your dietitian for more options. WHAT FOODS ARE NOT RECOMMENDED? Grains White bread. White pasta. White rice. Refined cornbread. Bagels and croissants. Crackers that contain trans fat. Vegetables Creamed or fried vegetables. Vegetables in a cheese sauce. Regular canned vegetables. Regular canned tomato sauce and paste. Regular tomato and vegetable juices. Fruits Dried fruits. Canned fruit in light or heavy syrup. Fruit juice. Meat and Other Protein Products Fatty cuts of meat. Ribs, chicken wings, bacon, sausage, bologna, salami, chitterlings, fatback, hot dogs, bratwurst, and packaged luncheon meats. Salted nuts and seeds. Canned beans with salt. Dairy Whole or 2% milk, cream, half-and-half, and cream cheese. Whole-fat or sweetened yogurt. Full-fat cheeses or blue cheese. Nondairy creamers and whipped toppings. Processed cheese, cheese spreads, or cheese  curds. Condiments Onion and garlic salt, seasoned salt, table salt, and sea salt. Canned and packaged gravies. Worcestershire sauce. Tartar sauce. Barbecue sauce. Teriyaki sauce. Soy sauce, including reduced sodium. Steak sauce. Fish sauce. Oyster sauce. Cocktail sauce. Horseradish. Ketchup and mustard. Meat flavorings and tenderizers. Bouillon cubes. Hot sauce. Tabasco sauce. Marinades. Taco seasonings. Relishes. Fats and Oils Butter, stick margarine, lard, shortening, ghee, and bacon fat. Coconut, palm kernel, or palm oils. Regular salad dressings. Other Pickles and olives. Salted popcorn and pretzels. The items listed above may not be a complete list of foods and beverages to avoid. Contact your dietitian for more information. WHERE CAN I FIND MORE INFORMATION? National Heart, Lung, and Blood Institute: travelstabloid.com Document Released: 06/21/2011 Document Revised: 11/16/2013 Document Reviewed: 05/06/2013 [facility] Patient Information 2015 [place], [place]. This information is not intended to replace advice given to you by your health care provider. Make sure you discuss any questions you have with your health care provider. Hypertension Hypertension, commonly called high blood pressure, is when the force of blood pumping through your arteries is too strong. Your arteries are the blood vessels that carry blood from your heart throughout your body. A blood pressure reading consists of a higher number over a lower number, such as 110/72. The higher  number (systolic) is the pressure inside your arteries when your heart pumps. The lower number (diastolic) is the pressure inside your arteries when your heart relaxes. Ideally you want your blood pressure below 120/80. Hypertension forces your heart to work harder to pump blood. Your arteries may become narrow or stiff. Having hypertension puts you at risk for heart disease, stroke, and other problems.  RISK  FACTORS Some risk factors for high blood pressure are controllable. Others are not.  Risk factors you cannot control include:   Race. You may be at higher risk if you are African American.  Age. Risk increases with age.  Gender. Men are at higher risk than women before age 13 years. After age 76, women are at higher risk than men. Risk factors you can control include:  Not getting enough exercise or physical activity.  Being overweight.  Getting too much fat, sugar, calories, or salt in your diet.  Drinking too much alcohol. SIGNS AND SYMPTOMS Hypertension does not usually cause signs or symptoms. Extremely high blood pressure (hypertensive crisis) may cause headache, anxiety, shortness of breath, and nosebleed. DIAGNOSIS  To check if you have hypertension, your health care provider will measure your blood pressure while you are seated, with your arm held at the level of your heart. It should be measured at least twice using the same arm. Certain conditions can cause a difference in blood pressure between your right and left arms. A blood pressure reading that is higher than normal on one occasion does not mean that you need treatment. If one blood pressure reading is high, ask your health care provider about having it checked again. TREATMENT  Treating high blood pressure includes making lifestyle changes and possibly taking medicine. Living a healthy lifestyle can help lower high blood pressure. You may need to change some of your habits. Lifestyle changes may include:  Following the DASH diet. This diet is high in fruits, vegetables, and whole grains. It is low in salt, red meat, and added sugars.  Getting at least 2 hours of brisk physical activity every week.  Losing weight if necessary.  Not smoking.  Limiting alcoholic beverages.  Learning ways to reduce stress. If lifestyle changes are not enough to get your blood pressure under control, your health care provider may  prescribe medicine. You may need to take more than one. Work closely with your health care provider to understand the risks and benefits. HOME CARE INSTRUCTIONS  Have your blood pressure rechecked as directed by your health care provider.   Take medicines only as directed by your health care provider. Follow the directions carefully. Blood pressure medicines must be taken as prescribed. The medicine does not work as well when you skip doses. Skipping doses also puts you at risk for problems.   Do not smoke.   Monitor your blood pressure at home as directed by your health care provider. SEEK MEDICAL CARE IF:   You think you are having a reaction to medicines taken.  You have recurrent headaches or feel dizzy.  You have swelling in your ankles.  You have trouble with your vision. SEEK IMMEDIATE MEDICAL CARE IF:  You develop a severe headache or confusion.  You have unusual weakness, numbness, or feel faint.  You have severe chest or abdominal pain.  You vomit repeatedly.  You have trouble breathing. MAKE SURE YOU:   Understand these instructions.  Will watch your condition.  Will get help right away if you are not doing well or  get worse. Document Released: 07/02/2005 Document Revised: 11/16/2013 Document Reviewed: 04/24/2013 [facility] Patient Information 2015 [place], [place]. This information is not intended to replace advice given to you by your health care provider. Make sure you discuss any questions you have with your health care provider.

## 2014-09-24 NOTE — Progress Notes (Signed)
Subjective:    Patient ID: Ashley Nelson, female    DOB: [birth year], 81 y.o.   MRN: [id number]  Gastrophageal Reflux She reports no belching, no coughing, no heartburn or no sore throat. This is a chronic problem. The current episode started more than 1 year ago. The problem occurs rarely. The problem has been resolved. The symptoms are aggravated by certain foods. Pertinent negatives include no muscle weakness. She has tried a diet change for the symptoms. The treatment provided significant relief.  Wrist Pain  The pain is present in the right wrist and right hand. This is a recurrent problem. The current episode started more than 1 month ago. There has been no history of extremity trauma. The problem occurs constantly. The problem has been waxing and waning. The quality of the pain is described as sharp. The pain is at a severity of 5/10. The pain is moderate. Associated symptoms include an inability to bear weight, joint swelling, a limited range of motion and stiffness. Pertinent negatives include no tingling. She has tried nothing for the symptoms. The treatment provided mild relief.   Pt see's an Endocrinologists who manages her Thyroid.   Review of Systems  Constitutional: Negative.   HENT: Negative.  Negative for sore throat.   Eyes: Negative.   Respiratory: Negative.  Negative for cough and shortness of breath.   Cardiovascular: Negative.  Negative for palpitations.  Gastrointestinal: Negative.  Negative for heartburn.  Endocrine: Negative.   Genitourinary: Negative.   Musculoskeletal: Positive for stiffness. Negative for muscle weakness.  Neurological: Negative.  Negative for tingling and headaches.  Hematological: Negative.   Psychiatric/Behavioral: Negative.   All other systems reviewed and are negative.      Objective:   Physical Exam  Constitutional: She is oriented to person, place, and time. She appears well-developed and well-nourished. No distress.  HENT:  Head:  Normocephalic and atraumatic.  Right Ear: External ear normal.  Left Ear: External ear normal.  Mouth/Throat: Oropharynx is clear and moist.  Eyes: Pupils are equal, round, and reactive to light.  Neck: Normal range of motion. Neck supple. No thyromegaly present.  Cardiovascular: Normal rate, regular rhythm, normal heart sounds and intact distal pulses.   No murmur heard. Pulmonary/Chest: Effort normal and breath sounds normal. No respiratory distress. She has no wheezes.  Abdominal: Soft. Bowel sounds are normal. She exhibits no distension. There is no tenderness.  Musculoskeletal: She exhibits edema and tenderness.  Swelling and tenderness present in right wrist with warmth  Neurological: She is alert and oriented to person, place, and time. She has normal reflexes. No cranial nerve deficit.  Skin: Skin is warm and dry.  Psychiatric: She has a normal mood and affect. Her behavior is normal. Judgment and thought content normal.  Vitals reviewed.   BP 167/84 mmHg  Pulse 76  Temp(Src) 97.2 F (36.2 C) (Oral)  Ht 4' 11.5" (1.511 m)  Wt 120 lb 6.4 oz (54.613 kg)  BMI 23.92 kg/m2  X-ray- WNL Preliminary reading by [name], FNP [facility]      Assessment & Plan:  1. Hypothyroidism, unspecified hypothyroidism type - CMP14+EGFR  2. Gastroesophageal reflux disease, esophagitis presence not specified - CMP14+EGFR  3. Right wrist pain - DG Wrist Complete Right; Future - CMP14+EGFR - Arthritis Panel  4. Encounter for vitamin deficiency screening - CMP14+EGFR - Vit D  25 hydroxy (rtn osteoporosis monitoring)  5. Essential hypertension - lisinopril (PRINIVIL,ZESTRIL) 20 MG tablet; Take 1 tablet (20 mg total) by mouth daily.  Dispense: 90 tablet; Refill: 3 - CMP14+EGFR   Continue all meds Labs pending Health Maintenance reviewed Diet and exercise encouraged RTO 2 weeks to recheck hypertension and wrist pain  [name], FNP

## 2014-09-25 LAB — ARTHRITIS PANEL
Basophils Absolute: 0 10*3/uL (ref 0.0–0.2)
Basos: 1 %
Eos: 2 %
Eosinophils Absolute: 0.1 10*3/uL (ref 0.0–0.4)
HEMATOCRIT: 36.8 % (ref 34.0–46.6)
HEMOGLOBIN: 12 g/dL (ref 11.1–15.9)
IMMATURE GRANULOCYTES: 0 %
Immature Grans (Abs): 0 10*3/uL (ref 0.0–0.1)
Lymphocytes Absolute: 1.7 10*3/uL (ref 0.7–3.1)
Lymphs: 26 %
MCH: 31.4 pg (ref 26.6–33.0)
MCHC: 32.6 g/dL (ref 31.5–35.7)
MCV: 96 fL (ref 79–97)
Monocytes Absolute: 0.7 10*3/uL (ref 0.1–0.9)
Monocytes: 11 %
Neutrophils Absolute: 3.7 10*3/uL (ref 1.4–7.0)
Neutrophils Relative %: 60 %
Platelets: 236 10*3/uL (ref 150–379)
RBC: 3.82 x10E6/uL (ref 3.77–5.28)
RDW: 13.8 % (ref 12.3–15.4)
Rhuematoid fact SerPl-aCnc: 77.7 IU/mL — ABNORMAL HIGH (ref 0.0–13.9)
Sed Rate: 88 mm/hr — ABNORMAL HIGH (ref 0–40)
Uric Acid: 3.9 mg/dL (ref 2.5–7.1)
WBC: 6.3 10*3/uL (ref 3.4–10.8)

## 2014-09-25 LAB — CMP14+EGFR
ALBUMIN: 4 g/dL (ref 3.5–4.8)
ALT: 11 IU/L (ref 0–32)
AST: 17 IU/L (ref 0–40)
Albumin/Globulin Ratio: 1.3 (ref 1.1–2.5)
Alkaline Phosphatase: 65 IU/L (ref 39–117)
BUN/Creatinine Ratio: 16 (ref 11–26)
BUN: 11 mg/dL (ref 8–27)
Bilirubin Total: 0.4 mg/dL (ref 0.0–1.2)
CO2: 27 mmol/L (ref 18–29)
Calcium: 8.7 mg/dL (ref 8.7–10.3)
Chloride: 100 mmol/L (ref 97–108)
Creatinine, Ser: 0.68 mg/dL (ref 0.57–1.00)
GFR calc non Af Amer: 83 mL/min/{1.73_m2} (ref >59)
GFR, EST AFRICAN AMERICAN: 96 mL/min/{1.73_m2} (ref >59)
GLOBULIN, TOTAL: 3.2 g/dL (ref 1.5–4.5)
Glucose: 78 mg/dL (ref 65–99)
POTASSIUM: 4.1 mmol/L (ref 3.5–5.2)
Sodium: 142 mmol/L (ref 134–144)
Total Protein: 7.2 g/dL (ref 6.0–8.5)

## 2014-09-25 LAB — VITAMIN D 25 HYDROXY (VIT D DEFICIENCY, FRACTURES): Vit D, 25-Hydroxy: 23.7 ng/mL — ABNORMAL LOW (ref 30.0–100.0)

## 2014-09-27 ENCOUNTER — Other Ambulatory Visit: Payer: Self-pay | Admitting: Family

## 2014-09-27 DIAGNOSIS — E559 Vitamin D deficiency, unspecified: Secondary | ICD-10-CM | POA: Insufficient documentation

## 2014-09-27 DIAGNOSIS — R768 Other specified abnormal immunological findings in serum: Secondary | ICD-10-CM

## 2014-09-27 MED ORDER — VITAMIN D (ERGOCALCIFEROL) 1.25 MG (50000 UNIT) PO CAPS: 50000.0000 [IU] | ORAL_CAPSULE | ORAL | Status: DC

## 2014-10-14 ENCOUNTER — Encounter: Payer: Self-pay | Admitting: Family

## 2014-10-14 ENCOUNTER — Ambulatory Visit (INDEPENDENT_AMBULATORY_CARE_PROVIDER_SITE_OTHER): Payer: Medicare Other | Admitting: Family

## 2014-10-14 VITALS — BP 111/61 | HR 96 | Temp 97.2°F | Ht 59.5 in | Wt 121.0 lb

## 2014-10-14 DIAGNOSIS — I10 Essential (primary) hypertension: Secondary | ICD-10-CM | POA: Diagnosis not present

## 2014-10-14 DIAGNOSIS — M069 Rheumatoid arthritis, unspecified: Secondary | ICD-10-CM | POA: Diagnosis not present

## 2014-10-14 MED ORDER — TRAMADOL HCL 50 MG PO TABS: 50.0000 mg | ORAL_TABLET | Freq: Three times a day (TID) | ORAL | Status: DC | PRN

## 2014-10-14 NOTE — Progress Notes (Addendum)
   Subjective:    Patient ID: Ashley Nelson, female    DOB: 05/29/1933, 81 y.o.   MRN: [id number]  Pt presents to the office today to recheck HTN Pt's BP is at goal.  Wrist Pain  The pain is present in the right wrist, right hand, left wrist and left hand. This is a recurrent problem. The current episode started more than 1 month ago. There has been no history of extremity trauma. The problem occurs constantly. The problem has been unchanged. The quality of the pain is described as aching. The pain is at a severity of 10/10. The pain is mild. Associated symptoms include joint swelling. Pertinent negatives include no numbness or stiffness. She has tried NSAIDS for the symptoms. The treatment provided mild relief.  Hypertension This is a chronic problem. The current episode started more than 1 year ago. The problem has been resolved since onset. The problem is controlled. Pertinent negatives include no anxiety, headaches, palpitations, peripheral edema or shortness of breath. Risk factors for coronary artery disease include post-menopausal state. Past treatments include ACE inhibitors. The current treatment provides significant improvement. There is no history of kidney disease, CAD/MI, CVA, heart failure or a thyroid problem. There is no history of sleep apnea.      Review of Systems  Constitutional: Negative.   HENT: Negative.   Eyes: Negative.   Respiratory: Negative.  Negative for shortness of breath.   Cardiovascular: Negative.  Negative for palpitations.  Gastrointestinal: Negative.   Endocrine: Negative.   Genitourinary: Negative.   Musculoskeletal: Negative.  Negative for stiffness.  Neurological: Negative.  Negative for numbness and headaches.  Hematological: Negative.   Psychiatric/Behavioral: Negative.   All other systems reviewed and are negative.      Objective:   Physical Exam  Constitutional: She is oriented to person, place, and time. She appears well-developed and  well-nourished. No distress.  HENT:  Head: Normocephalic and atraumatic.  Right Ear: External ear normal.  Mouth/Throat: Oropharynx is clear and moist.  Eyes: Pupils are equal, round, and reactive to light.  Neck: Normal range of motion. Neck supple. No thyromegaly present.  Cardiovascular: Normal rate, regular rhythm, normal heart sounds and intact distal pulses.   No murmur heard. Pulmonary/Chest: Effort normal and breath sounds normal. No respiratory distress. She has no wheezes.  Abdominal: Soft. Bowel sounds are normal. She exhibits no distension. There is no tenderness.  Musculoskeletal: Normal range of motion. She exhibits no edema or tenderness.  Neurological: She is alert and oriented to person, place, and time. She has normal reflexes. No cranial nerve deficit.  Skin: Skin is warm and dry.  Psychiatric: She has a normal mood and affect. Her behavior is normal. Judgment and thought content normal.  Vitals reviewed.   BP 111/61 mmHg  Pulse 96  Temp(Src) 97.2 F (36.2 C) (Oral)  Ht 4' 11.5" (1.511 m)  Wt 121 lb (54.885 kg)  BMI 24.04 kg/m2       Assessment & Plan:  1. Essential hypertension -Dash diet information given -Exercise encouraged - Stress Management  -Continue current meds -RTO in 6 months  2. Rheumatoid arthritis Keep appointment with Rheumatologists  Ice if helps Encouraged exercise and healthy diet - traMADol (ULTRAM) 50 MG tablet; Take 1 tablet (50 mg total) by mouth every 8 (eight) hours as needed.  Dispense: 60 tablet; Refill: [place], FNP

## 2014-10-14 NOTE — Patient Instructions (Signed)
Rheumatoid Arthritis °Rheumatoid arthritis is a long-term (chronic) inflammatory disease that causes pain, swelling, and stiffness of the joints. It can affect the entire body, including the eyes and lungs. The effects of rheumatoid arthritis vary widely among those with the condition. °CAUSES  °The cause of rheumatoid arthritis is not known. It tends to run in families and is more common in women. Certain cells of the body's natural defense system (immune system) do not work properly and begin to attack healthy joints. It primarily involves the connective tissue that lines the joints (synovial membrane). This can cause damage to the joint. °SYMPTOMS  °· Pain, stiffness, swelling, and decreased motion of many joints, especially in the hands and feet. °· Stiffness that is worse in the morning. It may last 1-2 hours or longer. °· Numbness and tingling in the hands. °· Fatigue. °· Loss of appetite. °· Weight loss. °· Low-grade fever. °· Dry eyes and mouth. °· Firm lumps (rheumatoid nodules) that grow beneath the skin in areas such as the elbows and hands. °DIAGNOSIS  °Diagnosis is based on the symptoms described, an exam, and blood tests. Sometimes, X-rays are helpful. °TREATMENT  °The goals of treatment are to relieve pain, reduce inflammation, and to slow down or stop joint damage and disability. Methods vary and may include: °· Maintaining a balance of rest, exercise, and proper nutrition. °· Medicines: °¨ Pain relievers (analgesics). °¨ Corticosteroids and nonsteroidal anti-inflammatory drugs (NSAIDs) to reduce inflammation. °¨ Disease-modifying antirheumatic drugs (DMARDs) to try to slow the course of the disease. °¨ Biologic response modifiers to reduce inflammation and damage. °· Physical therapy and occupational therapy. °· Surgery for patients with severe joint damage. Joint replacement or fusing of joints may be needed. °· Routine monitoring and ongoing care, such as office visits, blood and urine tests, and  X-rays. °HOME CARE INSTRUCTIONS  °· Remain physically active and reduce activity when the disease gets worse. °· Eat a well-balanced diet. °· Put heat on affected joints when you wake up and before activities. Keep the heat on the affected joint for as long as directed by your health care provider. °· Put ice on affected joints following activities or exercising. °¨ Put ice in a plastic bag. °¨ Place a towel between your skin and the bag. °¨ Leave the ice on for 15-20 minutes, 3-4 times per day, or as directed by your health care provider. °· Take medicines and supplements only as directed by your health care provider. °· Use splints as directed by your health care provider. Splints help maintain joint position and function. °· Do not sleep with pillows under your knees. This may lead to spasms. °· Participate in a self-management program to keep current with the latest treatment and coping skills. °SEEK IMMEDIATE MEDICAL CARE IF: °· You have fainting episodes. °· You have periods of extreme weakness. °· You rapidly develop a hot, painful joint that is more severe than usual joint aches. °· You have chills. °· You have a fever. °FOR MORE INFORMATION  °· [employer]: www.rheumatology.org °· Arth[employer]: www.arthritis.org °Document Released: 06/29/2000 Document Revised: 11/16/2013 Document Reviewed: 08/08/2011 °ExitCare® Patient Information ©2015 [employer]. This information is not intended to replace advice given to you by your health care provider. Make sure you discuss any questions you have with your health care provider. ° °

## 2014-10-18 ENCOUNTER — Encounter: Payer: Medicare Other | Admitting: Family

## 2014-12-16 DIAGNOSIS — M0589 Other rheumatoid arthritis with rheumatoid factor of multiple sites: Secondary | ICD-10-CM | POA: Diagnosis not present

## 2014-12-16 DIAGNOSIS — R768 Other specified abnormal immunological findings in serum: Secondary | ICD-10-CM | POA: Diagnosis not present

## 2014-12-16 DIAGNOSIS — M7989 Other specified soft tissue disorders: Secondary | ICD-10-CM | POA: Diagnosis not present

## 2014-12-16 DIAGNOSIS — M2550 Pain in unspecified joint: Secondary | ICD-10-CM | POA: Diagnosis not present

## 2014-12-21 DIAGNOSIS — M2550 Pain in unspecified joint: Secondary | ICD-10-CM | POA: Diagnosis not present

## 2014-12-21 DIAGNOSIS — M0589 Other rheumatoid arthritis with rheumatoid factor of multiple sites: Secondary | ICD-10-CM | POA: Diagnosis not present

## 2014-12-21 DIAGNOSIS — R768 Other specified abnormal immunological findings in serum: Secondary | ICD-10-CM | POA: Diagnosis not present

## 2014-12-21 DIAGNOSIS — M7989 Other specified soft tissue disorders: Secondary | ICD-10-CM | POA: Diagnosis not present

## 2015-01-27 DIAGNOSIS — M0589 Other rheumatoid arthritis with rheumatoid factor of multiple sites: Secondary | ICD-10-CM | POA: Diagnosis not present

## 2015-01-27 DIAGNOSIS — R768 Other specified abnormal immunological findings in serum: Secondary | ICD-10-CM | POA: Diagnosis not present

## 2015-01-27 DIAGNOSIS — M2550 Pain in unspecified joint: Secondary | ICD-10-CM | POA: Diagnosis not present

## 2015-01-27 DIAGNOSIS — Z79899 Other long term (current) drug therapy: Secondary | ICD-10-CM | POA: Diagnosis not present

## 2015-02-09 DIAGNOSIS — H25813 Combined forms of age-related cataract, bilateral: Secondary | ICD-10-CM | POA: Diagnosis not present

## 2015-02-21 DIAGNOSIS — H25813 Combined forms of age-related cataract, bilateral: Secondary | ICD-10-CM | POA: Diagnosis not present

## 2015-02-21 DIAGNOSIS — H02403 Unspecified ptosis of bilateral eyelids: Secondary | ICD-10-CM | POA: Diagnosis not present

## 2015-03-17 ENCOUNTER — Encounter (INDEPENDENT_AMBULATORY_CARE_PROVIDER_SITE_OTHER): Payer: Self-pay

## 2015-03-17 ENCOUNTER — Ambulatory Visit (INDEPENDENT_AMBULATORY_CARE_PROVIDER_SITE_OTHER): Payer: Medicare Other

## 2015-03-17 ENCOUNTER — Encounter: Payer: Self-pay | Admitting: Family

## 2015-03-17 ENCOUNTER — Ambulatory Visit (INDEPENDENT_AMBULATORY_CARE_PROVIDER_SITE_OTHER): Payer: Medicare Other | Admitting: Family

## 2015-03-17 VITALS — BP 166/84 | HR 75 | Temp 97.3°F | Ht 59.5 in | Wt 121.0 lb

## 2015-03-17 DIAGNOSIS — Z01818 Encounter for other preprocedural examination: Secondary | ICD-10-CM | POA: Diagnosis not present

## 2015-03-17 DIAGNOSIS — E039 Hypothyroidism, unspecified: Secondary | ICD-10-CM | POA: Diagnosis not present

## 2015-03-17 DIAGNOSIS — I10 Essential (primary) hypertension: Secondary | ICD-10-CM | POA: Diagnosis not present

## 2015-03-17 DIAGNOSIS — Z780 Asymptomatic menopausal state: Secondary | ICD-10-CM

## 2015-03-17 MED ORDER — HYDROCHLOROTHIAZIDE 12.5 MG PO TABS: 12.5000 mg | ORAL_TABLET | Freq: Every day | ORAL | Status: DC

## 2015-03-17 MED ORDER — LEVOTHYROXINE SODIUM 50 MCG PO TABS: 50.0000 ug | ORAL_TABLET | Freq: Every day | ORAL | Status: DC

## 2015-03-17 NOTE — Progress Notes (Signed)
Subjective:    Patient ID: Ashley Nelson, female    DOB: July 05, 1933, 81 y.o.   MRN: [id number]  Pt presents to the office today for surgical clarence to remove cataracts bilaterally on April 11 2015.  Hypertension This is a chronic problem. The current episode started more than 1 year ago. The problem has been waxing and waning since onset. The problem is uncontrolled. Pertinent negatives include no anxiety, headaches, palpitations, peripheral edema, shortness of breath or sweats. Past treatments include nothing. The current treatment provides no improvement. Hypertensive end-organ damage includes a thyroid problem. There is no history of kidney disease, CAD/MI, CVA or heart failure. There is no history of sleep apnea.  Thyroid Problem Presents for follow-up visit. Symptoms include dry skin. Patient reports no cold intolerance, depressed mood, diarrhea, fatigue, hoarse voice, nail problem or palpitations. The symptoms have been stable. Past treatments include levothyroxine. The treatment provided significant relief. There is no history of heart failure.      Review of Systems  Constitutional: Negative.  Negative for fatigue.  HENT: Negative.  Negative for hoarse voice.   Eyes: Negative.   Respiratory: Negative.  Negative for shortness of breath.   Cardiovascular: Negative.  Negative for palpitations.  Gastrointestinal: Negative.  Negative for diarrhea.  Endocrine: Negative.  Negative for cold intolerance.  Genitourinary: Negative.   Musculoskeletal: Negative.   Neurological: Negative.  Negative for headaches.  Hematological: Negative.   Psychiatric/Behavioral: Negative.   All other systems reviewed and are negative.      Objective:   Physical Exam  Constitutional: She is oriented to person, place, and time. She appears well-developed and well-nourished. No distress.  HENT:  Head: Normocephalic and atraumatic.  Right Ear: External ear normal.  Left Ear: External ear normal.    Nose: Nose normal.  Mouth/Throat: Oropharynx is clear and moist.  Eyes: Pupils are equal, round, and reactive to light.  Neck: Normal range of motion. Neck supple. No thyromegaly present.  Cardiovascular: Normal rate, regular rhythm, normal heart sounds and intact distal pulses.   No murmur heard. Pulmonary/Chest: Effort normal and breath sounds normal. No respiratory distress. She has no wheezes.  Abdominal: Soft. Bowel sounds are normal. She exhibits no distension. There is no tenderness.  Musculoskeletal: Normal range of motion. She exhibits no edema or tenderness.  Neurological: She is alert and oriented to person, place, and time. She has normal reflexes. No cranial nerve deficit.  Skin: Skin is warm and dry.  Psychiatric: She has a normal mood and affect. Her behavior is normal. Judgment and thought content normal.  Vitals reviewed.   BP 166/84 mmHg  Pulse 75  Temp(Src) 97.3 F (36.3 C) (Oral)  Ht 4' 11.5" (1.511 m)  Wt 121 lb (54.885 kg)  BMI 24.04 kg/m2       Assessment & Plan:  1. Pre-op examination - DG Chest 2 View; Future - EKG 12-Lead - CBC with Differential/Platelet - CMP14+EGFR - Thyroid Panel With TSH  2. Essential hypertension -Pt started on HCTZ 12.5 mg today -Dash diet information given -Exercise encouraged - Stress Management  -Continue current meds -RTO in 2 weeks - CMP14+EGFR - hydrochlorothiazide (HYDRODIURIL) 12.5 MG tablet; Take 1 tablet (12.5 mg total) by mouth daily.  Dispense: 90 tablet; Refill: 3  3. Hypothyroidism, unspecified hypothyroidism type - CMP14+EGFR - Thyroid Panel With TSH  4. Post-menopause - DG Bone Density; Future   Continue all meds Labs pending Health Maintenance reviewed Diet and exercise encouraged RTO 6 months  [name], FNP

## 2015-03-17 NOTE — Patient Instructions (Signed)
Cataract Surgery ° A cataract is a clouding of the lens of the eye. When a lens becomes cloudy, vision is reduced based on the degree and nature of the clouding. Surgery may be needed to improve vision. Surgery removes the cloudy lens and usually replaces it with a substitute lens (intraocular lens, IOL). °LET YOUR EYE DOCTOR KNOW ABOUT: °· Allergies to food or medicine. °· Medicines taken including herbs, eye drops, over-the-counter medicines, and creams. °· Use of steroids (by mouth or creams). °· Previous problems with anesthetics or numbing medicine. °· History of bleeding problems or blood clots. °· Previous surgery. °· Other health problems, including diabetes and kidney problems. °· Possibility of pregnancy, if this applies. °RISKS AND COMPLICATIONS °· Infection. °· Inflammation of the eyeball (endophthalmitis) that can spread to both eyes (sympathetic ophthalmia). °· Poor wound healing. °· If an IOL is inserted, it can later fall out of proper position. This is very uncommon. °· Clouding of the part of your eye that holds an IOL in place. This is called an "after-cataract." These are uncommon but easily treated. °BEFORE THE PROCEDURE °· Do not eat or drink anything except small amounts of water for 8 to 12 before your surgery, or as directed by your caregiver.  °· Unless you are told otherwise, continue any eye drops you have been prescribed. °· Talk to your primary caregiver about all other medicines that you take (both prescription and nonprescription). In some cases, you may need to stop or change medicines near the time of your surgery. This is most important if you are taking blood-thinning medicine. Do not stop medicines unless you are told to do so. °· Arrange for someone to drive you to and from the procedure. °· Do not put contact lenses in either eye on the day of your surgery. °PROCEDURE °There is more than one method for safely removing a cataract. Your doctor can explain the differences and help  determine which is best for you. °Phacoemulsification surgery is the most common form of cataract surgery. °· An injection is given behind the eye or eye drops are given to make this a painless procedure. °· A small cut (incision) is made on the edge of the clear, dome-shaped surface that covers the front of the eye (cornea). °· A tiny probe is painlessly inserted into the eye. This device gives off ultrasound waves that soften and break up the cloudy center of the lens. This makes it easier for the cloudy lens to be removed by suction. °· An IOL may be implanted. °· The normal lens of the eye is covered by a clear capsule. Part of that capsule is intentionally left in the eye to support the IOL. °· Your surgeon may or may not use stitches to close the incision. °There are other forms of cataract surgery that require a larger incision and stitches to close the eye. This approach is taken in cases where the doctor feels that the cataract cannot be easily removed using phacoemulsification. °AFTER THE PROCEDURE °· When an IOL is implanted, it does not need care. It becomes a permanent part of your eye and cannot be seen or felt. °· Your doctor will schedule follow-up exams to check on your progress. °· Review your other medicines with your doctor to see which can be resumed after surgery. °· Use eye drops or take medicine as prescribed by your doctor. °Document Released: 06/21/2011 Document Revised: 11/16/2013 Document Reviewed: 06/21/2011 °ExitCare® Patient Information ©2015 [employer]. This information is not intended to   replace advice given to you by your health care provider. Make sure you discuss any questions you have with your health care provider.  

## 2015-03-18 LAB — CBC WITH DIFFERENTIAL/PLATELET
BASOS: 1 %
Basophils Absolute: 0 10*3/uL (ref 0.0–0.2)
EOS (ABSOLUTE): 0.1 10*3/uL (ref 0.0–0.4)
EOS: 2 %
HEMATOCRIT: 36.9 % (ref 34.0–46.6)
Hemoglobin: 12.2 g/dL (ref 11.1–15.9)
Immature Grans (Abs): 0 10*3/uL (ref 0.0–0.1)
Immature Granulocytes: 0 %
LYMPHS ABS: 2.4 10*3/uL (ref 0.7–3.1)
Lymphs: 36 %
MCH: 31.8 pg (ref 26.6–33.0)
MCHC: 33.1 g/dL (ref 31.5–35.7)
MCV: 96 fL (ref 79–97)
MONOS ABS: 0.6 10*3/uL (ref 0.1–0.9)
Monocytes: 9 %
Neutrophils Absolute: 3.4 10*3/uL (ref 1.4–7.0)
Neutrophils: 52 %
Platelets: 202 10*3/uL (ref 150–379)
RBC: 3.84 x10E6/uL (ref 3.77–5.28)
RDW: 16.1 % — AB (ref 12.3–15.4)
WBC: 6.5 10*3/uL (ref 3.4–10.8)

## 2015-03-18 LAB — THYROID PANEL WITH TSH
Free Thyroxine Index: 2.4 (ref 1.2–4.9)
T3 Uptake Ratio: 30 % (ref 24–39)
T4 TOTAL: 8.1 ug/dL (ref 4.5–12.0)
TSH: 3.73 u[IU]/mL (ref 0.450–4.500)

## 2015-03-18 LAB — CMP14+EGFR
A/G RATIO: 1.3 (ref 1.1–2.5)
ALK PHOS: 49 IU/L (ref 39–117)
ALT: 11 IU/L (ref 0–32)
AST: 14 IU/L (ref 0–40)
Albumin: 4.1 g/dL (ref 3.5–4.7)
BILIRUBIN TOTAL: 0.6 mg/dL (ref 0.0–1.2)
BUN/Creatinine Ratio: 14 (ref 11–26)
BUN: 11 mg/dL (ref 8–27)
CALCIUM: 9 mg/dL (ref 8.7–10.3)
CO2: 26 mmol/L (ref 18–29)
Chloride: 100 mmol/L (ref 97–108)
Creatinine, Ser: 0.77 mg/dL (ref 0.57–1.00)
GFR calc Af Amer: 84 mL/min/{1.73_m2} (ref >59)
GFR, EST NON AFRICAN AMERICAN: 73 mL/min/{1.73_m2} (ref >59)
GLOBULIN, TOTAL: 3.1 g/dL (ref 1.5–4.5)
Glucose: 76 mg/dL (ref 65–99)
POTASSIUM: 3.7 mmol/L (ref 3.5–5.2)
SODIUM: 140 mmol/L (ref 134–144)
Total Protein: 7.2 g/dL (ref 6.0–8.5)

## 2015-03-29 ENCOUNTER — Ambulatory Visit (INDEPENDENT_AMBULATORY_CARE_PROVIDER_SITE_OTHER): Payer: Medicare Other | Admitting: Family

## 2015-03-29 ENCOUNTER — Encounter: Payer: Self-pay | Admitting: Family

## 2015-03-29 ENCOUNTER — Ambulatory Visit (INDEPENDENT_AMBULATORY_CARE_PROVIDER_SITE_OTHER): Payer: Medicare Other

## 2015-03-29 VITALS — BP 154/84 | HR 66 | Temp 96.8°F | Ht 59.5 in | Wt 121.6 lb

## 2015-03-29 DIAGNOSIS — Z7689 Persons encountering health services in other specified circumstances: Secondary | ICD-10-CM | POA: Diagnosis not present

## 2015-03-29 DIAGNOSIS — I10 Essential (primary) hypertension: Secondary | ICD-10-CM | POA: Diagnosis not present

## 2015-03-29 DIAGNOSIS — Z780 Asymptomatic menopausal state: Secondary | ICD-10-CM

## 2015-03-29 NOTE — Progress Notes (Signed)
   Subjective:    Patient ID: Ashley Nelson, female    DOB: 08/09/1932, 81 y.o.   MRN: [id number]  Pt presents to the office today to recheck HTN. Pt's BP is improved since her visit.  Hypertension This is a chronic problem. The current episode started more than 1 year ago. The problem has been waxing and waning since onset. The problem is controlled. Pertinent negatives include no anxiety, headaches, palpitations, peripheral edema or shortness of breath. Risk factors for coronary artery disease include post-menopausal state, dyslipidemia and family history. Past treatments include diuretics. The current treatment provides moderate improvement. There is no history of kidney disease, CAD/MI, CVA, heart failure or a thyroid problem. There is no history of sleep apnea.      Review of Systems  Constitutional: Negative.   HENT: Negative.   Eyes: Negative.   Respiratory: Negative.  Negative for shortness of breath.   Cardiovascular: Negative.  Negative for palpitations.  Gastrointestinal: Negative.   Endocrine: Negative.   Genitourinary: Negative.   Musculoskeletal: Negative.   Neurological: Negative.  Negative for headaches.  Hematological: Negative.   Psychiatric/Behavioral: Negative.   All other systems reviewed and are negative.      Objective:   Physical Exam  Constitutional: She is oriented to person, place, and time. She appears well-developed and well-nourished. No distress.  HENT:  Head: Normocephalic and atraumatic.  Right Ear: External ear normal.  Left Ear: External ear normal.  Nose: Nose normal.  Mouth/Throat: Oropharynx is clear and moist.  Eyes: Pupils are equal, round, and reactive to light.  Neck: Normal range of motion. Neck supple. No thyromegaly present.  Cardiovascular: Normal rate, regular rhythm, normal heart sounds and intact distal pulses.   No murmur heard. Pulmonary/Chest: Effort normal and breath sounds normal. No respiratory distress. She has no  wheezes.  Abdominal: Soft. Bowel sounds are normal. She exhibits no distension. There is no tenderness.  Musculoskeletal: Normal range of motion. She exhibits no edema or tenderness.  Neurological: She is alert and oriented to person, place, and time. She has normal reflexes. No cranial nerve deficit.  Skin: Skin is warm and dry.  Psychiatric: She has a normal mood and affect. Her behavior is normal. Judgment and thought content normal.  Vitals reviewed.     BP 154/84 mmHg  Pulse 66  Temp(Src) 96.8 F (36 C) (Oral)  Ht 4' 11.5" (1.511 m)  Wt 121 lb 9.6 oz (55.157 kg)  BMI 24.16 kg/m2     Assessment & Plan:  1. Essential hypertension -Dash diet information given -Exercise encouraged - Stress Management  -Continue current meds -RTO in 3 months - BMP8+EGFR  Christy Hawks, FNP  

## 2015-03-29 NOTE — Patient Instructions (Signed)
Hypertension Hypertension, commonly called high blood pressure, is when the force of blood pumping through your arteries is too strong. Your arteries are the blood vessels that carry blood from your heart throughout your body. A blood pressure reading consists of a higher number over a lower number, such as 110/72. The higher number (systolic) is the pressure inside your arteries when your heart pumps. The lower number (diastolic) is the pressure inside your arteries when your heart relaxes. Ideally you want your blood pressure below 120/80. Hypertension forces your heart to work harder to pump blood. Your arteries may become narrow or stiff. Having hypertension puts you at risk for heart disease, stroke, and other problems.  RISK FACTORS Some risk factors for high blood pressure are controllable. Others are not.  Risk factors you cannot control include:   Race. You may be at higher risk if you are African American.  Age. Risk increases with age.  Gender. Men are at higher risk than women before age 45 years. After age 65, women are at higher risk than men. Risk factors you can control include:  Not getting enough exercise or physical activity.  Being overweight.  Getting too much fat, sugar, calories, or salt in your diet.  Drinking too much alcohol. SIGNS AND SYMPTOMS Hypertension does not usually cause signs or symptoms. Extremely high blood pressure (hypertensive crisis) may cause headache, anxiety, shortness of breath, and nosebleed. DIAGNOSIS  To check if you have hypertension, your health care provider will measure your blood pressure while you are seated, with your arm held at the level of your heart. It should be measured at least twice using the same arm. Certain conditions can cause a difference in blood pressure between your right and left arms. A blood pressure reading that is higher than normal on one occasion does not mean that you need treatment. If one blood pressure reading  is high, ask your health care provider about having it checked again. TREATMENT  Treating high blood pressure includes making lifestyle changes and possibly taking medicine. Living a healthy lifestyle can help lower high blood pressure. You may need to change some of your habits. Lifestyle changes may include:  Following the DASH diet. This diet is high in fruits, vegetables, and whole grains. It is low in salt, red meat, and added sugars.  Getting at least 2 hours of brisk physical activity every week.  Losing weight if necessary.  Not smoking.  Limiting alcoholic beverages.  Learning ways to reduce stress. If lifestyle changes are not enough to get your blood pressure under control, your health care provider may prescribe medicine. You may need to take more than one. Work closely with your health care provider to understand the risks and benefits. HOME CARE INSTRUCTIONS  Have your blood pressure rechecked as directed by your health care provider.   Take medicines only as directed by your health care provider. Follow the directions carefully. Blood pressure medicines must be taken as prescribed. The medicine does not work as well when you skip doses. Skipping doses also puts you at risk for problems.   Do not smoke.   Monitor your blood pressure at home as directed by your health care provider. SEEK MEDICAL CARE IF:   You think you are having a reaction to medicines taken.  You have recurrent headaches or feel dizzy.  You have swelling in your ankles.  You have trouble with your vision. SEEK IMMEDIATE MEDICAL CARE IF:  You develop a severe headache or confusion.    You have unusual weakness, numbness, or feel faint.  You have severe chest or abdominal pain.  You vomit repeatedly.  You have trouble breathing. MAKE SURE YOU:   Understand these instructions.  Will watch your condition.  Will get help right away if you are not doing well or get worse. Document  Released: 07/02/2005 Document Revised: 11/16/2013 Document Reviewed: 04/24/2013 ExitCare Patient Information 2015 [employer]. This information is not intended to replace advice given to you by your health care provider. Make sure you discuss any questions you have with your health care provider. DASH Eating Plan DASH stands for "Dietary Approaches to Stop Hypertension." The DASH eating plan is a healthy eating plan that has been shown to reduce high blood pressure (hypertension). Additional health benefits may include reducing the risk of type 2 diabetes mellitus, heart disease, and stroke. The DASH eating plan may also help with weight loss. WHAT DO I NEED TO KNOW ABOUT THE DASH EATING PLAN? For the DASH eating plan, you will follow these general guidelines:  Choose foods with a percent daily value for sodium of less than 5% (as listed on the food label).  Use salt-free seasonings or herbs instead of table salt or sea salt.  Check with your health care provider or pharmacist before using salt substitutes.  Eat lower-sodium products, often labeled as "lower sodium" or "no salt added."  Eat fresh foods.  Eat more vegetables, fruits, and low-fat dairy products.  Choose whole grains. Look for the word "whole" as the first word in the ingredient list.  Choose fish and skinless chicken or turkey more often than red meat. Limit fish, poultry, and meat to 6 oz (170 g) each day.  Limit sweets, desserts, sugars, and sugary drinks.  Choose heart-healthy fats.  Limit cheese to 1 oz (28 g) per day.  Eat more home-cooked food and less restaurant, buffet, and fast food.  Limit fried foods.  Cook foods using methods other than frying.  Limit canned vegetables. If you do use them, rinse them well to decrease the sodium.  When eating at a restaurant, ask that your food be prepared with less salt, or no salt if possible. WHAT FOODS CAN I EAT? Seek help from a dietitian for individual  calorie needs. Grains Whole grain or whole wheat bread. Brown rice. Whole grain or whole wheat pasta. Quinoa, bulgur, and whole grain cereals. Low-sodium cereals. Corn or whole wheat flour tortillas. Whole grain cornbread. Whole grain crackers. Low-sodium crackers. Vegetables Fresh or frozen vegetables (raw, steamed, roasted, or grilled). Low-sodium or reduced-sodium tomato and vegetable juices. Low-sodium or reduced-sodium tomato sauce and paste. Low-sodium or reduced-sodium canned vegetables.  Fruits All fresh, canned (in natural juice), or frozen fruits. Meat and Other Protein Products Ground beef (85% or leaner), grass-fed beef, or beef trimmed of fat. Skinless chicken or turkey. Ground chicken or turkey. Pork trimmed of fat. All fish and seafood. Eggs. Dried beans, peas, or lentils. Unsalted nuts and seeds. Unsalted canned beans. Dairy Low-fat dairy products, such as skim or 1% milk, 2% or reduced-fat cheeses, low-fat ricotta or cottage cheese, or plain low-fat yogurt. Low-sodium or reduced-sodium cheeses. Fats and Oils Tub margarines without trans fats. Light or reduced-fat mayonnaise and salad dressings (reduced sodium). Avocado. Safflower, olive, or canola oils. Natural peanut or almond butter. Other Unsalted popcorn and pretzels. The items listed above may not be a complete list of recommended foods or beverages. Contact your dietitian for more options. WHAT FOODS ARE NOT RECOMMENDED? Grains White bread.   White pasta. White rice. Refined cornbread. Bagels and croissants. Crackers that contain trans fat. Vegetables Creamed or fried vegetables. Vegetables in a cheese sauce. Regular canned vegetables. Regular canned tomato sauce and paste. Regular tomato and vegetable juices. Fruits Dried fruits. Canned fruit in light or heavy syrup. Fruit juice. Meat and Other Protein Products Fatty cuts of meat. Ribs, chicken wings, bacon, sausage, bologna, salami, chitterlings, fatback, hot dogs,  bratwurst, and packaged luncheon meats. Salted nuts and seeds. Canned beans with salt. Dairy Whole or 2% milk, cream, half-and-half, and cream cheese. Whole-fat or sweetened yogurt. Full-fat cheeses or blue cheese. Nondairy creamers and whipped toppings. Processed cheese, cheese spreads, or cheese curds. Condiments Onion and garlic salt, seasoned salt, table salt, and sea salt. Canned and packaged gravies. Worcestershire sauce. Tartar sauce. Barbecue sauce. Teriyaki sauce. Soy sauce, including reduced sodium. Steak sauce. Fish sauce. Oyster sauce. Cocktail sauce. Horseradish. Ketchup and mustard. Meat flavorings and tenderizers. Bouillon cubes. Hot sauce. Tabasco sauce. Marinades. Taco seasonings. Relishes. Fats and Oils Butter, stick margarine, lard, shortening, ghee, and bacon fat. Coconut, palm kernel, or palm oils. Regular salad dressings. Other Pickles and olives. Salted popcorn and pretzels. The items listed above may not be a complete list of foods and beverages to avoid. Contact your dietitian for more information. WHERE CAN I FIND MORE INFORMATION? National Heart, [employer], [employer] Institute: www.nhlbi.nih.gov/health/health-topics/topics/dash/ Document Released: 06/21/2011 Document Revised: 11/16/2013 Document Reviewed: 05/06/2013 ExitCare Patient Information 2015 [employer]. This information is not intended to replace advice given to you by your health care provider. Make sure you discuss any questions you have with your health care provider.  

## 2015-03-29 NOTE — Addendum Note (Signed)
Addended by: Earlene Plater on: 03/29/2015 11:49 AM   Modules accepted: Miquel Dunn

## 2015-03-30 ENCOUNTER — Encounter: Payer: Self-pay | Admitting: Family

## 2015-03-30 LAB — BMP8+EGFR
BUN/Creatinine Ratio: 18 (ref 11–26)
BUN: 14 mg/dL (ref 8–27)
CO2: 27 mmol/L (ref 18–29)
Calcium: 9 mg/dL (ref 8.7–10.3)
Chloride: 98 mmol/L (ref 97–108)
Creatinine, Ser: 0.8 mg/dL (ref 0.57–1.00)
GFR calc Af Amer: 81 mL/min/{1.73_m2} (ref >59)
GFR, EST NON AFRICAN AMERICAN: 70 mL/min/{1.73_m2} (ref >59)
GLUCOSE: 73 mg/dL (ref 65–99)
POTASSIUM: 3.7 mmol/L (ref 3.5–5.2)
SODIUM: 142 mmol/L (ref 134–144)

## 2015-03-31 ENCOUNTER — Encounter: Payer: Self-pay | Admitting: Pharmacist

## 2015-03-31 ENCOUNTER — Ambulatory Visit (INDEPENDENT_AMBULATORY_CARE_PROVIDER_SITE_OTHER): Payer: Medicare Other | Admitting: Pharmacist

## 2015-03-31 VITALS — Ht <= 58 in | Wt 121.0 lb

## 2015-03-31 DIAGNOSIS — E559 Vitamin D deficiency, unspecified: Secondary | ICD-10-CM | POA: Diagnosis not present

## 2015-03-31 DIAGNOSIS — M810 Age-related osteoporosis without current pathological fracture: Secondary | ICD-10-CM | POA: Diagnosis not present

## 2015-03-31 MED ORDER — CALCIUM CARB-CHOLECALCIFEROL 500-400 MG-UNIT PO TABS: 1.0000 | ORAL_TABLET | Freq: Every day | ORAL | Status: DC

## 2015-03-31 NOTE — Patient Instructions (Addendum)
Will add vitamin D levels to labs drawn on Tuesday, September 13th Restart Bone up once a day - take in afternoon or evening Calcium goal is 1228m daily from diet and/or supplementation   Osteoporosis Throughout your life, your body breaks down old bone and replaces it with new bone. As you get older, your body does not replace bone as quickly as it breaks it down. By the age of 344years, most people begin to gradually lose bone because of the imbalance between bone loss and replacement. Some people lose more bone than others. Bone loss beyond a specified normal degree is considered osteoporosis.  Osteoporosis affects the strength and durability of your bones. The inside of the ends of your bones and your flat bones, like the bones of your pelvis, look like honeycomb, filled with tiny open spaces. As bone loss occurs, your bones become less dense. This means that the open spaces inside your bones become bigger and the walls between these spaces become thinner. This makes your bones weaker. Bones of a person with osteoporosis can become so weak that they can break (fracture) during minor accidents, such as a simple fall. CAUSES  The following factors have been associated with the development of osteoporosis:  Smoking.  Drinking more than 2 alcoholic drinks several days per week.  Long-term use of certain medicines:  Corticosteroids.  Chemotherapy medicines.  Thyroid medicines.  Antiepileptic medicines.  Gonadal hormone suppression medicine.  Immunosuppression medicine.  Being underweight.  Lack of physical activity.  Lack of exposure to the sun. This can lead to vitamin D deficiency.  Certain medical conditions:  Certain inflammatory bowel diseases, such as Crohn disease and ulcerative colitis.  Diabetes.  Hyperthyroidism.  Hyperparathyroidism. RISK FACTORS Anyone can develop osteoporosis. However, the following factors can increase your risk of developing  osteoporosis:  Gender--Women are at higher risk than men.  Age--Being older than 50 years increases your risk.  Ethnicity--White and Asian people have an increased risk.  Weight --Being extremely underweight can increase your risk of osteoporosis.  Family history of osteoporosis--Having a family member who has developed osteoporosis can increase your risk. SYMPTOMS  Usually, people with osteoporosis have no symptoms.  DIAGNOSIS  Signs during a physical exam that may prompt your caregiver to suspect osteoporosis include:  Decreased height. This is usually caused by the compression of the bones that form your spine (vertebrae) because they have weakened and become fractured.  A curving or rounding of the upper back (kyphosis). To confirm signs of osteoporosis, your caregiver may request a procedure that uses 2 low-dose X-ray beams with different levels of energy to measure your bone mineral density (dual-energy X-ray absorptiometry [DXA]). Also, your caregiver may check your level of vitamin D. TREATMENT  The goal of osteoporosis treatment is to strengthen bones in order to decrease the risk of bone fractures. There are different types of medicines available to help achieve this goal. Some of these medicines work by slowing the processes of bone loss. Some medicines work by increasing bone density. Treatment also involves making sure that your levels of calcium and vitamin D are adequate. PREVENTION  There are things you can do to help prevent osteoporosis. Adequate intake of calcium and vitamin D can help you achieve optimal bone mineral density. Regular exercise can also help, especially resistance and weight-bearing activities. If you smoke, quitting smoking is an important part of osteoporosis prevention. MAKE SURE YOU:  Understand these instructions.  Will watch your condition.  Will get help right  away if you are not doing well or get worse. FOR MORE INFORMATION www.osteo.org and  EquipmentWeekly.com.ee Document Released: 04/11/2005 Document Revised: 10/27/2012 Document Reviewed: 06/16/2011 [facility] Patient Information 2015 [place], [place]. This information is not intended to replace advice given to you by your health care provider. Make sure you discuss any questions you have with your health care provider.  Calcium & Vitamin D: The Facts  Why is calcium and vitamin D consumption important? Calcium: . Most Americans do not consume adequate amounts of calcium! Calcium is required for proper muscle function, nerve communication, bone support, and many other functions in the body.  . The body uses bones as a source of calcium. Bones 'remodel' themselves continuously - the body constantly breaks bone down to release calcium and rebuilds bones by replacing calcium in the bone later.  . As we get older, the rate of bone breakdown occurs faster than bone rebuilding which could lead to osteopenia, osteoporosis, and possible fractures.   Vitamin D: . People naturally make vitamin D in the body when sunlight hits the skin and triggers a process that leads to vitamin D production. This natural vitamin D production requires about 10-15 minutes of sun exposure on the hands, arms, and face at least 2-3 times per week. However, due to decreased sun exposure and the use of sunscreen, most people will need to get additional vitamin D from foods or supplements. Your doctor can measure your body's vitamin D level through a simple blood test to determine your daily vitamin D needs.  . Vitamin D is used to help the body absorb calcium, maintain bone health, help the immune system, and reduce inflammation. It also plays a role in muscle performance, balance and risk of falling.  . Vitamin D deficiency can lead to osteomalacia or softening of the bones, bone pain, and muscle weakness.   The recommended daily allowance of Calcium and Vitamin D varies for different age groups. Age group Calcium (mg) Vitamin D  (IU)  Females and Males: Age 1-50 1000 mg 600 IU  Females: Age 75- 100 1200 mg 600 IU  Males: Age 81-70 1000 mg 600 IU  Females and Males: Age 41+ 1200 mg 800 IU  Pregnant/lactating Females age 32-50 1000 mg 600 IU   How much Calcium do you get in your diet? Calcium Intake # of servings per day  Total calcium (mg)  Skim milk, 2% milk (1 cup) _________ x 300 mg   Yogurt (1 small container) _________ x 200 mg   Cheese (1oz) _________ x 200 mg   Cottage Cheese (1 cup)             ________ x 150 mg   Almond milk (1 cup) _________ x 450 mg   Fortified Orange Juice (1 cup) _________ x 300 mg   Broccoli or spinach ( 1 cup) _________ x 100 mg   Salmon (3 oz) _________ x 150 mg    Almonds (1/4 cup) _______ x 90 mg      How do we get Calcium and Vitamin D in our diet? Calcium: . Obtaining calcium from the diet is the most preferred way to reach the recommended daily goal. If this goal is not reached through diet, calcium supplements are available.  . Calcium is found in many foods including: dairy products, dark leafy vegetables (like broccoli, kale, and spinach), fish, and fortified products like juices and cereals.  . The food label will have a %DV (percent daily value) listed showing the  amount of calcium per serving. To determine the total mg per serving, simply replace the % with zero (0).  For example, Almond Breeze almond milk contains 45% DV of calcium or 41m per 1 cup.  . You can increase the amount of calcium in your diet by using more calcium products in your daily meals. Use yogurt and fruit to make smoothies or use yogurt to top baked potatoes or make whipped potatoes. Sprinkle low fat cheese onto salads or into egg white omelets. You can even add non-fat dry milk powder (3081mcalcium per 1/3 cup) to hot cereals, meat loaf, soups, or potatoes.  . Calcium supplements come in many forms including tablets, chewables, and gummies. Be sure to read the label to determine the correct  number of tablets per serving and whether or not to take the supplement with food.  . Calcium carbonate products (Oscal, Caltrate, and Viactiv) are generally better absorbed when taken with food while calcium citrate products like Citracal can be taken with or without food.  . The body can only absorb about 600 mg of calcium at one time. It is recommended to take calcium supplements in small amounts several times per day.  However, taking it all at once is better than not taking it at all. . Increasing your intake of calcium is essential for bone health, but may also lead to some side effects like constipation, increased gas, bloating or abdominal cramping. To help reduce these side effects, start with 1 tablet per day and slowly increase your intake of the supplement to the recommended doses. It is also recommended that you drink plenty of water each day. Vitamin D: . Very few foods naturally contain vitamin D. However, it is found in saltwater fish (like tuna, salmon and mackerel), beef liver, egg yolks, cheese and vitamin D fortified foods (like yogurt, cereals, orange juice and milk) . The amount of vitamin D in each food or product is listed as %DV on the product label. To determine the total amount of vitamin D per serving, drop the % sign and multiply the number by 4. For example, 1 cup of Almond Breeze almond milk contains 25% DV vitamin D or 100 IU per serving (25 x 4 =100). . Vitamin D is also found in multivitamins and supplements and may be listed as ergocalciferol (vitamin D2) or cholecalciferol (vitamin D3). Each of these forms of vitamin D are equivalent and the daily recommended intake will vary based on your age and the vitamin D levels in your body. Follow your doctor's recommendation for vitamin D intake.                    Exercise for Strong Bones  Exercise is important to build and maintain strong bones / bone density.  There are 2 types of exercises that are important to building  and maintaining strong bones:  Weight- bearing and muscle-stregthening.  Weight-bearing Exercises  These exercises include activities that make you move against gravity while staying upright. Weight-bearing exercises can be high-impact or low-impact.  High-impact weight-bearing exercises help build bones and keep them strong. If you have broken a bone due to osteoporosis or are at risk of breaking a bone, you may need to avoid high-impact exercises. If you're not sure, you should check with your healthcare provider.  Examples of high-impact weight-bearing exercises are: Dancing  Doing high-impact aerobics  Hiking  Jogging/running  Jumping Rope  Stair climbing  Tennis  Low-impact weight-bearing exercises can also  help keep bones strong and are a safe alternative if you cannot do high-impact exercises.   Examples of low-impact weight-bearing exercises are: Using elliptical training machines  Doing low-impact aerobics  Using stair-step machines  Fast walking on a treadmill or outside   Muscle-Strengthening Exercises These exercises include activities where you move your body, a weight or some other resistance against gravity. They are also known as resistance exercises and include: Lifting weights  Using elastic exercise bands  Using weight machines  Lifting your own body weight  Functional movements, such as standing and rising up on your toes  Yoga and Pilates can also improve strength, balance and flexibility. However, certain positions may not be safe for people with osteoporosis or those at increased risk of broken bones. For example, exercises that have you bend forward may increase the chance of breaking a bone in the spine.   Non-Impact Exercises There are other types of exercises that can help prevent falls.  Non-impact exercises can help you to improve balance, posture and how well you move in everyday activities. Some of these exercises include: Balance exercises that  strengthen your legs and test your balance, such as Tai Chi, can decrease your risk of falls.  Posture exercises that improve your posture and reduce rounded or "sloping" shoulders can help you decrease the chance of breaking a bone, especially in the spine.  Functional exercises that improve how well you move can help you with everyday activities and decrease your chance of falling and breaking a bone. For example, if you have trouble getting up from a chair or climbing stairs, you should do these activities as exercises.   **A physical therapist can teach you balance, posture and functional exercises. He/she can also help you learn which exercises are safe and appropriate for you.  [facility] has a physical therapy office in [place] in front of our office and referrals can be made for assessments and treatment as needed and strength and balance training.  If you would like to have an assessment with [facility] and our physical therapy team please let a nurse or provider know.   Fall Prevention and Home Safety Falls cause injuries and can affect all age groups. It is possible to use preventive measures to significantly decrease the likelihood of falls. There are many simple measures which can make your home safer and prevent falls. OUTDOORS  Repair cracks and edges of walkways and driveways.  Remove high doorway thresholds.  Trim shrubbery on the main path into your home.  Have good outside lighting.  Clear walkways of tools, rocks, debris, and clutter.  Check that handrails are not broken and are securely fastened. Both sides of steps should have handrails.  Have leaves, snow, and ice cleared regularly.  Use sand or salt on walkways during winter months.  In the garage, clean up grease or oil spills. BATHROOM  Install night lights.  Install grab bars by the toilet and in the tub and shower.  Use non-skid mats or decals in the tub or shower.  Place a plastic non-slip stool in the  shower to sit on, if needed.  Keep floors dry and clean up all water on the floor immediately.  Remove soap buildup in the tub or shower on a regular basis.  Secure bath mats with non-slip, double-sided rug tape.  Remove throw rugs and tripping hazards from the floors. BEDROOMS  Install night lights.  Make sure a bedside light is easy to reach.  Do not  use oversized bedding.  Keep a telephone by your bedside.  Have a firm chair with side arms to use for getting dressed.  Remove throw rugs and tripping hazards from the floor. KITCHEN  Keep handles on pots and pans turned toward the center of the stove. Use back burners when possible.  Clean up spills quickly and allow time for drying.  Avoid walking on wet floors.  Avoid hot utensils and knives.  Position shelves so they are not too high or low.  Place commonly used objects within easy reach.  If necessary, use a sturdy step stool with a grab bar when reaching.  Keep electrical cables out of the way.  Do not use floor polish or wax that makes floors slippery. If you must use wax, use non-skid floor wax.  Remove throw rugs and tripping hazards from the floor. STAIRWAYS  Never leave objects on stairs.  Place handrails on both sides of stairways and use them. Fix any loose handrails. Make sure handrails on both sides of the stairways are as long as the stairs.  Check carpeting to make sure it is firmly attached along stairs. Make repairs to worn or loose carpet promptly.  Avoid placing throw rugs at the top or bottom of stairways, or properly secure the rug with carpet tape to prevent slippage. Get rid of throw rugs, if possible.  Have an electrician put in a light switch at the top and bottom of the stairs. OTHER FALL PREVENTION TIPS  Wear low-heel or rubber-soled shoes that are supportive and fit well. Wear closed toe shoes.  When using a stepladder, make sure it is fully opened and both spreaders are firmly  locked. Do not climb a closed stepladder.  Add color or contrast paint or tape to grab bars and handrails in your home. Place contrasting color strips on first and last steps.  Learn and use mobility aids as needed. Install an electrical emergency response system.  Turn on lights to avoid dark areas. Replace light bulbs that burn out immediately. Get light switches that glow.  Arrange furniture to create clear pathways. Keep furniture in the same place.  Firmly attach carpet with non-skid or double-sided tape.  Eliminate uneven floor surfaces.  Select a carpet pattern that does not visually hide the edge of steps.  Be aware of all pets. OTHER HOME SAFETY TIPS  Set the water temperature for 120 F (48.8 C).  Keep emergency numbers on or near the telephone.  Keep smoke detectors on every level of the home and near sleeping areas. Document Released: 06/22/2002 Document Revised: 01/01/2012 Document Reviewed: 09/21/2011 [facility] Patient Information 2015 [place], [place]. This information is not intended to replace advice given to you by your health care provider. Make sure you discuss any questions you have with your health care provider.

## 2015-03-31 NOTE — Progress Notes (Signed)
Patient ID: Ashley Nelson, female   DOB: October 30, 1932, 81 y.o.   MRN: [id number]  [facility] Current Height: Height: 4\' 10"  (147.3 cm)      Max Lifetime Height:  4\' 10"  Current Weight: Weight: 121 lb (54.885 kg)       HPI: Ashley Nelson was diagnosed with osteoporosis based on DEXA results from 04/17/2004.  She has had DEXA rechecked twice since then - 03/10/2007 and 03/29/2015 with improved BMD scores each time.   She as not taken any medications for osteoporosis in the past.   Ashley Nelson recently was diagnosed with rheumatoid arthritis and started methotrexate and prednisone.  Back Pain?  No       Kyphosis?  No Prior fracture?  No Med(s) for Osteoporosis/Osteopenia:  none Med(s) previously tried for Osteoporosis/Osteopenia:  none                                                             PMH: Age at menopause:  Last 30's Hysterectomy?  Yes Oophorectomy?  Yes HRT? Yes - Former.  Type/duration: 1 year or less Steroid Use?  Yes - Current.  Type/duration: 5mg  - 1 tablet daily - started about 6 months ago Thyroid med?  Yes History of cancer?  Yes History of digestive disorders (ie Crohn's)?  No Current or previous eating disorders?  No Last Vitamin D Result:  23.7 (09/24/2014) Last GFR Result:  70 (09/132016)   FH/SH: Family history of osteoporosis?  No Parent with history of hip fracture?  No Family history of breast cancer?  No Exercise?  Yes at little walking Smoking?  No Alcohol?  No    Calcium Assessment Calcium Intake  # of servings/day  Calcium mg  Milk (8 oz) 0  x  300  = 0  Yogurt (4 oz) 0 x  200 = 0  Cheese (1 oz) 0 x  200 = 0  Other Calcium sources   250mg   Ca supplement 0 = 0   Estimated calcium intake per day 250mg     DEXA Results Date of Test T-Score for AP Spine L1-L4 T-Score for Total Left Hip T-Score for Total Right Hip  03/29/2015 -2.0 -0.4 0.0  03/10/2007 -2.0 -0.3 0.0  04/17/2004 -2.6 -0.6 --  09/03/2000 -2.1 -0.5 --   FRAX 10 year  estimate: Total FX risk:  15%  (consider medication if >/= 20%) Hip FX risk:  5.2% (consider medication if >/= 3%)  Assessment: Osteoporosis - with improved BMD but new start of prednisone about 3 months ago. Vitamin D deficiency   Recommendations: 1.   Discussed BMD  / DEXA results and discussed fracture risk.  Ashley Nelson would like to discuss with her rheumatologist about possibly stopping prednisone.  Copy of DEXA given to patient to take to next appt and will also fax copy to rheumatologist's office 2.  recommend calcium 1200mg  daily through supplementation or diet.  3.  recommend weight bearing exercise - 30 minutes at least 4 days per week.   4.  Counseled and educated about fall risk and prevention. 5.  Add vitamin D level to lab drawn 03/29/15  Recheck DEXA:  1 year  Time spent counseling patient:  52 minutes   Cherre Robins, PharmD, CPP

## 2015-04-04 LAB — VITAMIN D 25 HYDROXY (VIT D DEFICIENCY, FRACTURES): Vit D, 25-Hydroxy: 20.7 ng/mL — ABNORMAL LOW (ref 30.0–100.0)

## 2015-04-04 LAB — SPECIMEN STATUS REPORT

## 2015-04-11 DIAGNOSIS — H2512 Age-related nuclear cataract, left eye: Secondary | ICD-10-CM | POA: Diagnosis not present

## 2015-04-11 DIAGNOSIS — H25812 Combined forms of age-related cataract, left eye: Secondary | ICD-10-CM | POA: Diagnosis not present

## 2015-04-11 DIAGNOSIS — H25813 Combined forms of age-related cataract, bilateral: Secondary | ICD-10-CM | POA: Diagnosis not present

## 2015-04-18 ENCOUNTER — Ambulatory Visit (INDEPENDENT_AMBULATORY_CARE_PROVIDER_SITE_OTHER): Payer: Medicare Other | Admitting: Family

## 2015-04-18 ENCOUNTER — Encounter: Payer: Self-pay | Admitting: Family

## 2015-04-18 VITALS — BP 146/76 | HR 66 | Temp 97.2°F | Ht 58.3 in | Wt 123.0 lb

## 2015-04-18 DIAGNOSIS — I10 Essential (primary) hypertension: Secondary | ICD-10-CM | POA: Diagnosis not present

## 2015-04-18 NOTE — Progress Notes (Signed)
   Subjective:    Patient ID: Ashley Nelson, female    DOB: 07/19/1932, 81 y.o.   MRN: [id number]  Pt presents to the office today to recheck HTN. Pt's BP is improved! Hypertension This is a chronic problem. The current episode started more than 1 year ago. The problem has been waxing and waning since onset. The problem is uncontrolled. Pertinent negatives include no anxiety, headaches, palpitations, peripheral edema or shortness of breath. Risk factors for coronary artery disease include dyslipidemia, obesity and post-menopausal state. Past treatments include diuretics and ACE inhibitors. The current treatment provides moderate improvement. Hypertensive end-organ damage includes a thyroid problem. There is no history of kidney disease, CAD/MI, CVA or heart failure.      Review of Systems  Constitutional: Negative.   HENT: Negative.   Eyes: Negative.   Respiratory: Negative.  Negative for shortness of breath.   Cardiovascular: Negative.  Negative for palpitations.  Gastrointestinal: Negative.   Endocrine: Negative.   Genitourinary: Negative.   Musculoskeletal: Negative.   Neurological: Negative.  Negative for headaches.  Hematological: Negative.   Psychiatric/Behavioral: Negative.   All other systems reviewed and are negative.      Objective:   Physical Exam  Constitutional: She is oriented to person, place, and time. She appears well-developed and well-nourished. No distress.  HENT:  Head: Normocephalic and atraumatic.  Right Ear: External ear normal.  Left Ear: External ear normal.  Nose: Nose normal.  Mouth/Throat: Oropharynx is clear and moist.  Eyes: Pupils are equal, round, and reactive to light.  Neck: Normal range of motion. Neck supple. No thyromegaly present.  Cardiovascular: Normal rate, regular rhythm, normal heart sounds and intact distal pulses.   No murmur heard. Pulmonary/Chest: Effort normal and breath sounds normal. No respiratory distress. She has no  wheezes.  Abdominal: Soft. Bowel sounds are normal. She exhibits no distension. There is no tenderness.  Musculoskeletal: Normal range of motion. She exhibits no edema or tenderness.  Neurological: She is alert and oriented to person, place, and time. She has normal reflexes. No cranial nerve deficit.  Skin: Skin is warm and dry.  Psychiatric: She has a normal mood and affect. Her behavior is normal. Judgment and thought content normal.  Vitals reviewed.     BP 146/76 mmHg  Pulse 66  Temp(Src) 97.2 F (36.2 C) (Oral)  Ht 4' 10.3" (1.481 m)  Wt 123 lb (55.792 kg)  BMI 25.44 kg/m2     Assessment & Plan:  1. Essential hypertension - BMP8+EGFR -Daily blood pressure log given with instructions on how to fill out and told to bring to next visit -Dash diet information given -Exercise encouraged - Stress Management  -Continue current meds -RTO in 3 months    [name], FNP

## 2015-04-18 NOTE — Patient Instructions (Signed)
DASH Eating Plan DASH stands for "Dietary Approaches to Stop Hypertension." The DASH eating plan is a healthy eating plan that has been shown to reduce high blood pressure (hypertension). Additional health benefits may include reducing the risk of type 2 diabetes mellitus, heart disease, and stroke. The DASH eating plan may also help with weight loss. WHAT DO I NEED TO KNOW ABOUT THE DASH EATING PLAN? For the DASH eating plan, you will follow these general guidelines:  Choose foods with a percent daily value for sodium of less than 5% (as listed on the food label).  Use salt-free seasonings or herbs instead of table salt or sea salt.  Check with your health care provider or pharmacist before using salt substitutes.  Eat lower-sodium products, often labeled as "lower sodium" or "no salt added."  Eat fresh foods.  Eat more vegetables, fruits, and low-fat dairy products.  Choose whole grains. Look for the word "whole" as the first word in the ingredient list.  Choose fish and skinless chicken or turkey more often than red meat. Limit fish, poultry, and meat to 6 oz (170 g) each day.  Limit sweets, desserts, sugars, and sugary drinks.  Choose heart-healthy fats.  Limit cheese to 1 oz (28 g) per day.  Eat more home-cooked food and less restaurant, buffet, and fast food.  Limit fried foods.  Cook foods using methods other than frying.  Limit canned vegetables. If you do use them, rinse them well to decrease the sodium.  When eating at a restaurant, ask that your food be prepared with less salt, or no salt if possible. WHAT FOODS CAN I EAT? Seek help from a dietitian for individual calorie needs. Grains Whole grain or whole wheat bread. Brown rice. Whole grain or whole wheat pasta. Quinoa, bulgur, and whole grain cereals. Low-sodium cereals. Corn or whole wheat flour tortillas. Whole grain cornbread. Whole grain crackers. Low-sodium crackers. Vegetables Fresh or frozen vegetables  (raw, steamed, roasted, or grilled). Low-sodium or reduced-sodium tomato and vegetable juices. Low-sodium or reduced-sodium tomato sauce and paste. Low-sodium or reduced-sodium canned vegetables.  Fruits All fresh, canned (in natural juice), or frozen fruits. Meat and Other Protein Products Ground beef (85% or leaner), grass-fed beef, or beef trimmed of fat. Skinless chicken or turkey. Ground chicken or turkey. Pork trimmed of fat. All fish and seafood. Eggs. Dried beans, peas, or lentils. Unsalted nuts and seeds. Unsalted canned beans. Dairy Low-fat dairy products, such as skim or 1% milk, 2% or reduced-fat cheeses, low-fat ricotta or cottage cheese, or plain low-fat yogurt. Low-sodium or reduced-sodium cheeses. Fats and Oils Tub margarines without trans fats. Light or reduced-fat mayonnaise and salad dressings (reduced sodium). Avocado. Safflower, olive, or canola oils. Natural peanut or almond butter. Other Unsalted popcorn and pretzels. The items listed above may not be a complete list of recommended foods or beverages. Contact your dietitian for more options. WHAT FOODS ARE NOT RECOMMENDED? Grains White bread. White pasta. White rice. Refined cornbread. Bagels and croissants. Crackers that contain trans fat. Vegetables Creamed or fried vegetables. Vegetables in a cheese sauce. Regular canned vegetables. Regular canned tomato sauce and paste. Regular tomato and vegetable juices. Fruits Dried fruits. Canned fruit in light or heavy syrup. Fruit juice. Meat and Other Protein Products Fatty cuts of meat. Ribs, chicken wings, bacon, sausage, bologna, salami, chitterlings, fatback, hot dogs, bratwurst, and packaged luncheon meats. Salted nuts and seeds. Canned beans with salt. Dairy Whole or 2% milk, cream, half-and-half, and cream cheese. Whole-fat or sweetened yogurt. Full-fat   cheeses or blue cheese. Nondairy creamers and whipped toppings. Processed cheese, cheese spreads, or cheese  curds. Condiments Onion and garlic salt, seasoned salt, table salt, and sea salt. Canned and packaged gravies. Worcestershire sauce. Tartar sauce. Barbecue sauce. Teriyaki sauce. Soy sauce, including reduced sodium. Steak sauce. Fish sauce. Oyster sauce. Cocktail sauce. Horseradish. Ketchup and mustard. Meat flavorings and tenderizers. Bouillon cubes. Hot sauce. Tabasco sauce. Marinades. Taco seasonings. Relishes. Fats and Oils Butter, stick margarine, lard, shortening, ghee, and bacon fat. Coconut, palm kernel, or palm oils. Regular salad dressings. Other Pickles and olives. Salted popcorn and pretzels. The items listed above may not be a complete list of foods and beverages to avoid. Contact your dietitian for more information. WHERE CAN I FIND MORE INFORMATION? [employer], [employer]: www.nhlbi.nih.gov/health/health-topics/topics/dash/ Document Released: 06/21/2011 Document Revised: 11/16/2013 Document Reviewed: 05/06/2013 Exit[employer] Patient Information 2015 [employer]. This information is not intended to replace advice given to you by your health care provider. Make sure you discuss any questions you have with your health care provider. Hypertension Hypertension, commonly called high blood pressure, is when the force of blood pumping through your arteries is too strong. Your arteries are the blood vessels that carry blood from your heart throughout your body. A blood pressure reading consists of a higher number over a lower number, such as 110/72. The higher number (systolic) is the pressure inside your arteries when your heart pumps. The lower number (diastolic) is the pressure inside your arteries when your heart relaxes. Ideally you want your blood pressure below 120/80. Hypertension forces your heart to work harder to pump blood. Your arteries may become narrow or stiff. Having hypertension puts you at risk for heart disease, stroke, and other problems.  RISK  FACTORS Some risk factors for high blood pressure are controllable. Others are not.  Risk factors you cannot control include:   Race. You may be at higher risk if you are African American.  Age. Risk increases with age.  Gender. Men are at higher risk than women before age 45 years. After age 65, women are at higher risk than men. Risk factors you can control include:  Not getting enough exercise or physical activity.  Being overweight.  Getting too much fat, sugar, calories, or salt in your diet.  Drinking too much alcohol. SIGNS AND SYMPTOMS Hypertension does not usually cause signs or symptoms. Extremely high blood pressure (hypertensive crisis) may cause headache, anxiety, shortness of breath, and nosebleed. DIAGNOSIS  To check if you have hypertension, your health care provider will measure your blood pressure while you are seated, with your arm held at the level of your heart. It should be measured at least twice using the same arm. Certain conditions can cause a difference in blood pressure between your right and left arms. A blood pressure reading that is higher than normal on one occasion does not mean that you need treatment. If one blood pressure reading is high, ask your health care provider about having it checked again. TREATMENT  Treating high blood pressure includes making lifestyle changes and possibly taking medicine. Living a healthy lifestyle can help lower high blood pressure. You may need to change some of your habits. Lifestyle changes may include:  Following the DASH diet. This diet is high in fruits, vegetables, and whole grains. It is low in salt, red meat, and added sugars.  Getting at least 2 hours of brisk physical activity every week.  Losing weight if necessary.  Not smoking.  Limiting   alcoholic beverages.  Learning ways to reduce stress. If lifestyle changes are not enough to get your blood pressure under control, your health care provider may  prescribe medicine. You may need to take more than one. Work closely with your health care provider to understand the risks and benefits. HOME CARE INSTRUCTIONS  Have your blood pressure rechecked as directed by your health care provider.   Take medicines only as directed by your health care provider. Follow the directions carefully. Blood pressure medicines must be taken as prescribed. The medicine does not work as well when you skip doses. Skipping doses also puts you at risk for problems.   Do not smoke.   Monitor your blood pressure at home as directed by your health care provider. SEEK MEDICAL CARE IF:   You think you are having a reaction to medicines taken.  You have recurrent headaches or feel dizzy.  You have swelling in your ankles.  You have trouble with your vision. SEEK IMMEDIATE MEDICAL CARE IF:  You develop a severe headache or confusion.  You have unusual weakness, numbness, or feel faint.  You have severe chest or abdominal pain.  You vomit repeatedly.  You have trouble breathing. MAKE SURE YOU:   Understand these instructions.  Will watch your condition.  Will get help right away if you are not doing well or get worse. Document Released: 07/02/2005 Document Revised: 11/16/2013 Document Reviewed: 04/24/2013 Exit[employer] Patient Information 2015 [employer]. This information is not intended to replace advice given to you by your health care provider. Make sure you discuss any questions you have with your health care provider.  

## 2015-04-19 ENCOUNTER — Encounter: Payer: Self-pay | Admitting: *Deleted

## 2015-04-19 LAB — BMP8+EGFR
BUN / CREAT RATIO: 19 (ref 11–26)
BUN: 16 mg/dL (ref 8–27)
CALCIUM: 9.2 mg/dL (ref 8.7–10.3)
CHLORIDE: 97 mmol/L (ref 97–108)
CO2: 30 mmol/L — ABNORMAL HIGH (ref 18–29)
Creatinine, Ser: 0.85 mg/dL (ref 0.57–1.00)
GFR, EST AFRICAN AMERICAN: 75 mL/min/{1.73_m2} (ref >59)
GFR, EST NON AFRICAN AMERICAN: 65 mL/min/{1.73_m2} (ref >59)
Glucose: 82 mg/dL (ref 65–99)
POTASSIUM: 3.6 mmol/L (ref 3.5–5.2)
SODIUM: 140 mmol/L (ref 134–144)

## 2015-04-27 ENCOUNTER — Encounter: Payer: Self-pay | Admitting: *Deleted

## 2015-05-02 ENCOUNTER — Telehealth: Payer: Self-pay | Admitting: Family

## 2015-05-17 DIAGNOSIS — R768 Other specified abnormal immunological findings in serum: Secondary | ICD-10-CM | POA: Diagnosis not present

## 2015-05-17 DIAGNOSIS — M2550 Pain in unspecified joint: Secondary | ICD-10-CM | POA: Diagnosis not present

## 2015-05-17 DIAGNOSIS — M0589 Other rheumatoid arthritis with rheumatoid factor of multiple sites: Secondary | ICD-10-CM | POA: Diagnosis not present

## 2015-05-17 DIAGNOSIS — Z79899 Other long term (current) drug therapy: Secondary | ICD-10-CM | POA: Diagnosis not present

## 2015-06-02 DIAGNOSIS — H25811 Combined forms of age-related cataract, right eye: Secondary | ICD-10-CM | POA: Diagnosis not present

## 2015-06-02 DIAGNOSIS — H2511 Age-related nuclear cataract, right eye: Secondary | ICD-10-CM | POA: Diagnosis not present

## 2015-07-05 DIAGNOSIS — H35351 Cystoid macular degeneration, right eye: Secondary | ICD-10-CM | POA: Diagnosis not present

## 2015-07-05 DIAGNOSIS — H59032 Cystoid macular edema following cataract surgery, left eye: Secondary | ICD-10-CM | POA: Diagnosis not present

## 2015-07-21 ENCOUNTER — Ambulatory Visit: Payer: Medicare Other | Admitting: Family

## 2015-07-22 ENCOUNTER — Encounter: Payer: Self-pay | Admitting: Family

## 2015-07-22 ENCOUNTER — Ambulatory Visit (INDEPENDENT_AMBULATORY_CARE_PROVIDER_SITE_OTHER): Payer: Medicare Other | Admitting: Family

## 2015-07-22 VITALS — BP 129/76 | HR 68 | Temp 97.9°F | Ht 58.3 in | Wt 129.0 lb

## 2015-07-22 DIAGNOSIS — E785 Hyperlipidemia, unspecified: Secondary | ICD-10-CM | POA: Diagnosis not present

## 2015-07-22 DIAGNOSIS — E559 Vitamin D deficiency, unspecified: Secondary | ICD-10-CM | POA: Diagnosis not present

## 2015-07-22 DIAGNOSIS — E039 Hypothyroidism, unspecified: Secondary | ICD-10-CM

## 2015-07-22 DIAGNOSIS — K219 Gastro-esophageal reflux disease without esophagitis: Secondary | ICD-10-CM

## 2015-07-22 DIAGNOSIS — M0579 Rheumatoid arthritis with rheumatoid factor of multiple sites without organ or systems involvement: Secondary | ICD-10-CM | POA: Diagnosis not present

## 2015-07-22 DIAGNOSIS — M810 Age-related osteoporosis without current pathological fracture: Secondary | ICD-10-CM

## 2015-07-22 DIAGNOSIS — I10 Essential (primary) hypertension: Secondary | ICD-10-CM

## 2015-07-22 MED ORDER — LEVOTHYROXINE SODIUM 50 MCG PO TABS: 50.0000 ug | ORAL_TABLET | Freq: Every day | ORAL | Status: DC

## 2015-07-22 NOTE — Progress Notes (Signed)
Subjective:    Patient ID: Ashley Nelson, female    DOB: 1933/03/19, 82 y.o.   MRN: [id number]  Pt presents to the office today for chronic follow up.  Gastroesophageal Reflux She reports no belching, no coughing, no heartburn or no sore throat. This is a chronic problem. The current episode started more than 1 year ago. The problem occurs rarely. The problem has been resolved. The symptoms are aggravated by certain foods. Pertinent negatives include no muscle weakness. She has tried a diet change for the symptoms. The treatment provided significant relief.  Hypertension This is a chronic problem. The current episode started more than 1 year ago. The problem has been waxing and waning since onset. The problem is uncontrolled. Pertinent negatives include no anxiety, headaches, palpitations, peripheral edema or shortness of breath. Risk factors for coronary artery disease include dyslipidemia, post-menopausal state and stress. Past treatments include diuretics. The current treatment provides mild improvement. Hypertensive end-organ damage includes a thyroid problem. There is no history of kidney disease, CAD/MI, CVA or heart failure.  Thyroid Problem Presents for follow-up visit. Symptoms include dry skin. Patient reports no cold intolerance, diarrhea, leg swelling or palpitations. The symptoms have been stable. Past treatments include levothyroxine. Her past medical history is significant for hyperlipidemia. There is no history of heart failure.  Hyperlipidemia This is a chronic problem. The current episode started more than 1 year ago. The problem is uncontrolled. Recent lipid tests were reviewed and are high. Pertinent negatives include no shortness of breath. Current antihyperlipidemic treatment includes diet change. The current treatment provides no improvement of lipids. Risk factors for coronary artery disease include dyslipidemia, family history and hypertension.      Review of Systems    Constitutional: Negative.   HENT: Negative.  Negative for sore throat.   Eyes: Negative.   Respiratory: Negative.  Negative for cough and shortness of breath.   Cardiovascular: Negative.  Negative for palpitations.  Gastrointestinal: Negative.  Negative for heartburn and diarrhea.  Endocrine: Negative.  Negative for cold intolerance.  Genitourinary: Negative.   Musculoskeletal: Negative.  Negative for muscle weakness.  Neurological: Negative.  Negative for headaches.  Hematological: Negative.   Psychiatric/Behavioral: Negative.   All other systems reviewed and are negative.      Objective:   Physical Exam  Constitutional: She is oriented to person, place, and time. She appears well-developed and well-nourished. No distress.  HENT:  Head: Normocephalic and atraumatic.  Right Ear: External ear normal.  Left Ear: External ear normal.  Nose: Nose normal.  Mouth/Throat: Oropharynx is clear and moist.  Eyes: Pupils are equal, round, and reactive to light.  Neck: Normal range of motion. Neck supple. No thyromegaly present.  Cardiovascular: Normal rate, regular rhythm, normal heart sounds and intact distal pulses.   No murmur heard. Pulmonary/Chest: Effort normal and breath sounds normal. No respiratory distress. She has no wheezes.  Abdominal: Soft. Bowel sounds are normal. She exhibits no distension. There is no tenderness.  Musculoskeletal: Normal range of motion. She exhibits no edema or tenderness.  Neurological: She is alert and oriented to person, place, and time. She has normal reflexes. No cranial nerve deficit.  Skin: Skin is warm and dry.  Psychiatric: She has a normal mood and affect. Her behavior is normal. Judgment and thought content normal.  Vitals reviewed.     BP 152/79 mmHg  Pulse 85  Temp(Src) 97.9 F (36.6 C) (Oral)  Ht 4' 10.3" (1.481 m)  Wt 129 lb (58.514 kg)  BMI  26.68 kg/m2     Assessment & Plan:  1. Essential hypertension - CMP14+EGFR  2.  Gastroesophageal reflux disease, esophagitis presence not specified - CMP14+EGFR  3. Hypothyroidism, unspecified hypothyroidism type - CMP14+EGFR - levothyroxine (SYNTHROID, LEVOTHROID) 50 MCG tablet; Take 1 tablet (50 mcg total) by mouth daily.  Dispense: 90 tablet; Refill: 2  4. Rheumatoid arthritis involving multiple sites with positive rheumatoid factor (HCC) - CMP14+EGFR  5. Vitamin D deficiency - CMP14+EGFR - VITAMIN D 25 Hydroxy (Vit-D Deficiency, Fractures)  6. Osteoporosis - CMP14+EGFR - VITAMIN D 25 Hydroxy (Vit-D Deficiency, Fractures)  7. Hyperlipidemia - CMP14+EGFR - Lipid panel   Continue all meds and keep all chronic follow up with Rheumatologist  Labs pending Health Maintenance reviewed Diet and exercise encouraged RTO 6 months  [name], FNP

## 2015-07-22 NOTE — Patient Instructions (Signed)
Health Maintenance, Female Adopting a healthy lifestyle and getting preventive care can go a long way to promote health and wellness. Talk with your health care provider about what schedule of regular examinations is right for you. This is a good chance for you to check in with your provider about disease prevention and staying healthy. In between checkups, there are plenty of things you can do on your own. Experts have done a lot of research about which lifestyle changes and preventive measures are most likely to keep you healthy. Ask your health care provider for more information. WEIGHT AND DIET  Eat a healthy diet  Be sure to include plenty of vegetables, fruits, low-fat dairy products, and lean protein.  Do not eat a lot of foods high in solid fats, added sugars, or salt.  Get regular exercise. This is one of the most important things you can do for your health.  Most adults should exercise for at least 150 minutes each week. The exercise should increase your heart rate and make you sweat (moderate-intensity exercise).  Most adults should also do strengthening exercises at least twice a week. This is in addition to the moderate-intensity exercise.  Maintain a healthy weight  Body mass index (BMI) is a measurement that can be used to identify possible weight problems. It estimates body fat based on height and weight. Your health care provider can help determine your BMI and help you achieve or maintain a healthy weight.  For females 20 years of age and older:   A BMI below 18.5 is considered underweight.  A BMI of 18.5 to 24.9 is normal.  A BMI of 25 to 29.9 is considered overweight.  A BMI of 30 and above is considered obese.  Watch levels of cholesterol and blood lipids  You should start having your blood tested for lipids and cholesterol at 80 years of age, then have this test every 5 years.  You may need to have your cholesterol levels checked more often if:  Your lipid  or cholesterol levels are high.  You are older than 80 years of age.  You are at high risk for heart disease.  CANCER SCREENING   Lung Cancer  Lung cancer screening is recommended for adults 55-80 years old who are at high risk for lung cancer because of a history of smoking.  A yearly low-dose CT scan of the lungs is recommended for people who:  Currently smoke.  Have quit within the past 15 years.  Have at least a 30-pack-year history of smoking. A pack year is smoking an average of one pack of cigarettes a day for 1 year.  Yearly screening should continue until it has been 15 years since you quit.  Yearly screening should stop if you develop a health problem that would prevent you from having lung cancer treatment.  Breast Cancer  Practice breast self-awareness. This means understanding how your breasts normally appear and feel.  It also means doing regular breast self-exams. Let your health care provider know about any changes, no matter how small.  If you are in your 20s or 30s, you should have a clinical breast exam (CBE) by a health care provider every 1-3 years as part of a regular health exam.  If you are 40 or older, have a CBE every year. Also consider having a breast X-ray (mammogram) every year.  If you have a family history of breast cancer, talk to your health care provider about genetic screening.  If you   are at high risk for breast cancer, talk to your health care provider about having an MRI and a mammogram every year.  Breast cancer gene (BRCA) assessment is recommended for women who have family members with BRCA-related cancers. BRCA-related cancers include:  Breast.  Ovarian.  Tubal.  Peritoneal cancers.  Results of the assessment will determine the need for genetic counseling and BRCA1 and BRCA2 testing. Cervical Cancer Your health care provider may recommend that you be screened regularly for cancer of the pelvic organs (ovaries, uterus, and  vagina). This screening involves a pelvic examination, including checking for microscopic changes to the surface of your cervix (Pap test). You may be encouraged to have this screening done every 3 years, beginning at age 21.  For women ages 30-65, health care providers may recommend pelvic exams and Pap testing every 3 years, or they may recommend the Pap and pelvic exam, combined with testing for human papilloma virus (HPV), every 5 years. Some types of HPV increase your risk of cervical cancer. Testing for HPV may also be done on women of any age with unclear Pap test results.  Other health care providers may not recommend any screening for nonpregnant women who are considered low risk for pelvic cancer and who do not have symptoms. Ask your health care provider if a screening pelvic exam is right for you.  If you have had past treatment for cervical cancer or a condition that could lead to cancer, you need Pap tests and screening for cancer for at least 20 years after your treatment. If Pap tests have been discontinued, your risk factors (such as having a new sexual partner) need to be reassessed to determine if screening should resume. Some women have medical problems that increase the chance of getting cervical cancer. In these cases, your health care provider may recommend more frequent screening and Pap tests. Colorectal Cancer  This type of cancer can be detected and often prevented.  Routine colorectal cancer screening usually begins at 80 years of age and continues through 80 years of age.  Your health care provider may recommend screening at an earlier age if you have risk factors for colon cancer.  Your health care provider may also recommend using home test kits to check for hidden blood in the stool.  A small camera at the end of a tube can be used to examine your colon directly (sigmoidoscopy or colonoscopy). This is done to check for the earliest forms of colorectal  cancer.  Routine screening usually begins at age 50.  Direct examination of the colon should be repeated every 5-10 years through 80 years of age. However, you may need to be screened more often if early forms of precancerous polyps or small growths are found. Skin Cancer  Check your skin from head to toe regularly.  Tell your health care provider about any new moles or changes in moles, especially if there is a change in a mole's shape or color.  Also tell your health care provider if you have a mole that is larger than the size of a pencil eraser.  Always use sunscreen. Apply sunscreen liberally and repeatedly throughout the day.  Protect yourself by wearing long sleeves, pants, a wide-brimmed hat, and sunglasses whenever you are outside. HEART DISEASE, DIABETES, AND HIGH BLOOD PRESSURE   High blood pressure causes heart disease and increases the risk of stroke. High blood pressure is more likely to develop in:  People who have blood pressure in the high end   of the normal range (130-139/85-89 mm Hg).  People who are overweight or obese.  People who are African American.  If you are 38-23 years of age, have your blood pressure checked every 3-5 years. If you are 61 years of age or older, have your blood pressure checked every year. You should have your blood pressure measured twice--once when you are at a hospital or clinic, and once when you are not at a hospital or clinic. Record the average of the two measurements. To check your blood pressure when you are not at a hospital or clinic, you can use:  An automated blood pressure machine at a pharmacy.  A home blood pressure monitor.  If you are between 45 years and 39 years old, ask your health care provider if you should take aspirin to prevent strokes.  Have regular diabetes screenings. This involves taking a blood sample to check your fasting blood sugar level.  If you are at a normal weight and have a low risk for diabetes,  have this test once every three years after 80 years of age.  If you are overweight and have a high risk for diabetes, consider being tested at a younger age or more often. PREVENTING INFECTION  Hepatitis B  If you have a higher risk for hepatitis B, you should be screened for this virus. You are considered at high risk for hepatitis B if:  You were born in a country where hepatitis B is common. Ask your health care provider which countries are considered high risk.  Your parents were born in a high-risk country, and you have not been immunized against hepatitis B (hepatitis B vaccine).  You have HIV or AIDS.  You use needles to inject street drugs.  You live with someone who has hepatitis B.  You have had sex with someone who has hepatitis B.  You get hemodialysis treatment.  You take certain medicines for conditions, including cancer, organ transplantation, and autoimmune conditions. Hepatitis C  Blood testing is recommended for:  Everyone born from [birth year] through 1963.  Anyone with known risk factors for hepatitis C. Sexually transmitted infections (STIs)  You should be screened for sexually transmitted infections (STIs) including gonorrhea and chlamydia if:  You are sexually active and are younger than 80 years of age.  You are older than 80 years of age and your health care provider tells you that you are at risk for this type of infection.  Your sexual activity has changed since you were last screened and you are at an increased risk for chlamydia or gonorrhea. Ask your health care provider if you are at risk.  If you do not have HIV, but are at risk, it may be recommended that you take a prescription medicine daily to prevent HIV infection. This is called pre-exposure prophylaxis (PrEP). You are considered at risk if:  You are sexually active and do not regularly use condoms or know the HIV status of your partner(s).  You take drugs by injection.  You are sexually  active with a partner who has HIV. Talk with your health care provider about whether you are at high risk of being infected with HIV. If you choose to begin PrEP, you should first be tested for HIV. You should then be tested every 3 months for as long as you are taking PrEP.  PREGNANCY   If you are premenopausal and you may become pregnant, ask your health care provider about preconception counseling.  If you may  become pregnant, take 400 to 800 micrograms (mcg) of folic acid every day.  If you want to prevent pregnancy, talk to your health care provider about birth control (contraception). OSTEOPOROSIS AND MENOPAUSE   Osteoporosis is a disease in which the bones lose minerals and strength with aging. This can result in serious bone fractures. Your risk for osteoporosis can be identified using a bone density scan.  If you are 61 years of age or older, or if you are at risk for osteoporosis and fractures, ask your health care provider if you should be screened.  Ask your health care provider whether you should take a calcium or vitamin D supplement to lower your risk for osteoporosis.  Menopause may have certain physical symptoms and risks.  Hormone replacement therapy may reduce some of these symptoms and risks. Talk to your health care provider about whether hormone replacement therapy is right for you.  HOME CARE INSTRUCTIONS   Schedule regular health, dental, and eye exams.  Stay current with your immunizations.   Do not use any tobacco products including cigarettes, chewing tobacco, or electronic cigarettes.  If you are pregnant, do not drink alcohol.  If you are breastfeeding, limit how much and how often you drink alcohol.  Limit alcohol intake to no more than 1 drink per day for nonpregnant women. One drink equals 12 ounces of beer, 5 ounces of wine, or 1 ounces of hard liquor.  Do not use street drugs.  Do not share needles.  Ask your health care provider for help if  you need support or information about quitting drugs.  Tell your health care provider if you often feel depressed.  Tell your health care provider if you have ever been abused or do not feel safe at home.   This information is not intended to replace advice given to you by your health care provider. Make sure you discuss any questions you have with your health care provider.   Document Released: 01/15/2011 Document Revised: 07/23/2014 Document Reviewed: 06/03/2013 Elsevier Interactive Patient Education [employer].

## 2015-07-23 LAB — CMP14+EGFR
A/G RATIO: 1.2 (ref 1.1–2.5)
ALK PHOS: 60 IU/L (ref 39–117)
ALT: 17 IU/L (ref 0–32)
AST: 19 IU/L (ref 0–40)
Albumin: 4.1 g/dL (ref 3.5–4.7)
BILIRUBIN TOTAL: 0.6 mg/dL (ref 0.0–1.2)
BUN / CREAT RATIO: 19 (ref 11–26)
BUN: 15 mg/dL (ref 8–27)
CALCIUM: 9.1 mg/dL (ref 8.7–10.3)
CHLORIDE: 98 mmol/L (ref 96–106)
CO2: 27 mmol/L (ref 18–29)
Creatinine, Ser: 0.8 mg/dL (ref 0.57–1.00)
GFR calc Af Amer: 81 mL/min/{1.73_m2} (ref >59)
GFR calc non Af Amer: 70 mL/min/{1.73_m2} (ref >59)
GLOBULIN, TOTAL: 3.4 g/dL (ref 1.5–4.5)
Glucose: 82 mg/dL (ref 65–99)
POTASSIUM: 3.7 mmol/L (ref 3.5–5.2)
SODIUM: 140 mmol/L (ref 134–144)
Total Protein: 7.5 g/dL (ref 6.0–8.5)

## 2015-07-23 LAB — THYROID PANEL WITH TSH
FREE THYROXINE INDEX: 2.1 (ref 1.2–4.9)
T3 UPTAKE RATIO: 27 % (ref 24–39)
T4, Total: 7.8 ug/dL (ref 4.5–12.0)
TSH: 2.54 u[IU]/mL (ref 0.450–4.500)

## 2015-07-23 LAB — LIPID PANEL
CHOL/HDL RATIO: 3.1 ratio (ref 0.0–4.4)
CHOLESTEROL TOTAL: 344 mg/dL — AB (ref 100–199)
HDL: 112 mg/dL (ref >39)
LDL Calculated: 209 mg/dL — ABNORMAL HIGH (ref 0–99)
TRIGLYCERIDES: 115 mg/dL (ref 0–149)
VLDL CHOLESTEROL CAL: 23 mg/dL (ref 5–40)

## 2015-07-23 LAB — VITAMIN D 25 HYDROXY (VIT D DEFICIENCY, FRACTURES): Vit D, 25-Hydroxy: 19.6 ng/mL — ABNORMAL LOW (ref 30.0–100.0)

## 2015-07-25 ENCOUNTER — Other Ambulatory Visit: Payer: Self-pay | Admitting: Family

## 2015-07-25 MED ORDER — VITAMIN D (ERGOCALCIFEROL) 1.25 MG (50000 UNIT) PO CAPS: 50000.0000 [IU] | ORAL_CAPSULE | ORAL | Status: DC

## 2015-08-02 DIAGNOSIS — H35351 Cystoid macular degeneration, right eye: Secondary | ICD-10-CM | POA: Diagnosis not present

## 2015-09-29 DIAGNOSIS — M2550 Pain in unspecified joint: Secondary | ICD-10-CM | POA: Diagnosis not present

## 2015-09-29 DIAGNOSIS — M0589 Other rheumatoid arthritis with rheumatoid factor of multiple sites: Secondary | ICD-10-CM | POA: Diagnosis not present

## 2015-09-29 DIAGNOSIS — Z79899 Other long term (current) drug therapy: Secondary | ICD-10-CM | POA: Diagnosis not present

## 2015-09-29 DIAGNOSIS — R768 Other specified abnormal immunological findings in serum: Secondary | ICD-10-CM | POA: Diagnosis not present

## 2015-10-21 ENCOUNTER — Other Ambulatory Visit: Payer: Self-pay | Admitting: *Deleted

## 2015-10-21 DIAGNOSIS — I10 Essential (primary) hypertension: Secondary | ICD-10-CM

## 2015-10-21 MED ORDER — HYDROCHLOROTHIAZIDE 12.5 MG PO TABS: 12.5000 mg | ORAL_TABLET | Freq: Every day | ORAL | Status: DC

## 2015-10-21 NOTE — Telephone Encounter (Signed)
Pt transferred pharmacies and the new pharmacy was unable to transfer rx from old pharmacy. RX sent into new pharmacy.

## 2015-12-29 DIAGNOSIS — M0589 Other rheumatoid arthritis with rheumatoid factor of multiple sites: Secondary | ICD-10-CM | POA: Diagnosis not present

## 2015-12-29 DIAGNOSIS — M2550 Pain in unspecified joint: Secondary | ICD-10-CM | POA: Diagnosis not present

## 2015-12-29 DIAGNOSIS — R768 Other specified abnormal immunological findings in serum: Secondary | ICD-10-CM | POA: Diagnosis not present

## 2015-12-29 DIAGNOSIS — Z79899 Other long term (current) drug therapy: Secondary | ICD-10-CM | POA: Diagnosis not present

## 2016-01-24 ENCOUNTER — Encounter: Payer: Self-pay | Admitting: Family

## 2016-01-24 ENCOUNTER — Ambulatory Visit (INDEPENDENT_AMBULATORY_CARE_PROVIDER_SITE_OTHER): Payer: Medicare Other | Admitting: Family

## 2016-01-24 VITALS — BP 125/68 | HR 74 | Temp 98.3°F | Ht 58.3 in | Wt 119.2 lb

## 2016-01-24 DIAGNOSIS — M0579 Rheumatoid arthritis with rheumatoid factor of multiple sites without organ or systems involvement: Secondary | ICD-10-CM | POA: Diagnosis not present

## 2016-01-24 DIAGNOSIS — M810 Age-related osteoporosis without current pathological fracture: Secondary | ICD-10-CM

## 2016-01-24 DIAGNOSIS — I10 Essential (primary) hypertension: Secondary | ICD-10-CM | POA: Diagnosis not present

## 2016-01-24 DIAGNOSIS — E785 Hyperlipidemia, unspecified: Secondary | ICD-10-CM | POA: Diagnosis not present

## 2016-01-24 DIAGNOSIS — E039 Hypothyroidism, unspecified: Secondary | ICD-10-CM | POA: Diagnosis not present

## 2016-01-24 DIAGNOSIS — E559 Vitamin D deficiency, unspecified: Secondary | ICD-10-CM

## 2016-01-24 DIAGNOSIS — B351 Tinea unguium: Secondary | ICD-10-CM

## 2016-01-24 DIAGNOSIS — K219 Gastro-esophageal reflux disease without esophagitis: Secondary | ICD-10-CM | POA: Diagnosis not present

## 2016-01-24 MED ORDER — TERBINAFINE HCL 250 MG PO TABS: 250.0000 mg | ORAL_TABLET | Freq: Every day | ORAL | Status: DC

## 2016-01-24 NOTE — Progress Notes (Signed)
Subjective:    Patient ID: Ashley Nelson, female    DOB: 1933-03-14, 82 y.o.   MRN: [id number]  Pt presents to the office today for chronic follow up. Pt has rheumatoid arthritis and is followed by Rheumatologists every 3 months. PT currently taking methotrexate.  Hypertension This is a chronic problem. The current episode started more than 1 year ago. The problem has been resolved since onset. The problem is controlled. Pertinent negatives include no anxiety, headaches, malaise/fatigue, palpitations, peripheral edema or shortness of breath. Risk factors for coronary artery disease include dyslipidemia, post-menopausal state and stress. Past treatments include diuretics. The current treatment provides mild improvement. Hypertensive end-organ damage includes a thyroid problem. There is no history of kidney disease, CAD/MI, CVA or heart failure.  Gastroesophageal Reflux She reports no belching, no coughing, no heartburn or no sore throat. This is a chronic problem. The current episode started more than 1 year ago. The problem occurs rarely. The problem has been resolved. The symptoms are aggravated by certain foods. Pertinent negatives include no muscle weakness. She has tried a diet change for the symptoms. The treatment provided significant relief.  Thyroid Problem Presents for follow-up visit. Symptoms include constipation and dry skin. Patient reports no cold intolerance, diarrhea, leg swelling or palpitations. The symptoms have been stable. Past treatments include levothyroxine. The treatment provided moderate relief. Her past medical history is significant for hyperlipidemia. There is no history of heart failure.  Hyperlipidemia This is a chronic problem. The current episode started more than 1 year ago. The problem is uncontrolled. Recent lipid tests were reviewed and are high. Pertinent negatives include no shortness of breath. Current antihyperlipidemic treatment includes diet change. The  current treatment provides no improvement of lipids. Risk factors for coronary artery disease include dyslipidemia, family history and hypertension.  Osteoporosis Pt's last Dexa Scan was on 03/29/15. Pt encouraged to restart calcium and vit d supplements.     Review of Systems  Constitutional: Negative.  Negative for malaise/fatigue.  HENT: Negative.  Negative for sore throat.   Eyes: Negative.   Respiratory: Negative.  Negative for cough and shortness of breath.   Cardiovascular: Negative.  Negative for palpitations.  Gastrointestinal: Positive for constipation. Negative for heartburn and diarrhea.  Endocrine: Negative.  Negative for cold intolerance.  Genitourinary: Negative.   Musculoskeletal: Negative.  Negative for muscle weakness.  Neurological: Negative.  Negative for headaches.  Hematological: Negative.   Psychiatric/Behavioral: Negative.   All other systems reviewed and are negative.      Objective:   Physical Exam  Constitutional: She is oriented to person, place, and time. She appears well-developed and well-nourished. No distress.  HENT:  Head: Normocephalic and atraumatic.  Right Ear: External ear normal.  Left Ear: External ear normal.  Nose: Nose normal.  Mouth/Throat: Oropharynx is clear and moist.  Eyes: Pupils are equal, round, and reactive to light.  Neck: Normal range of motion. Neck supple. No thyromegaly present.  Cardiovascular: Normal rate, regular rhythm, normal heart sounds and intact distal pulses.   No murmur heard. Pulmonary/Chest: Effort normal and breath sounds normal. No respiratory distress. She has no wheezes.  Abdominal: Soft. Bowel sounds are normal. She exhibits no distension. There is no tenderness.  Musculoskeletal: Normal range of motion. She exhibits no edema or tenderness.  Neurological: She is alert and oriented to person, place, and time. She has normal reflexes. No cranial nerve deficit.  Skin: Skin is warm and dry.  All toenails  with fungus and are thick and  discolored   Psychiatric: She has a normal mood and affect. Her behavior is normal. Judgment and thought content normal.  Vitals reviewed.     BP 125/68 mmHg  Pulse 74  Temp(Src) 98.3 F (36.8 C) (Oral)  Ht 4' 10.3" (1.481 m)  Wt 119 lb 3.2 oz (54.069 kg)  BMI 24.65 kg/m2     Assessment & Plan:  1. Essential hypertension - CMP14+EGFR  2. Gastroesophageal reflux disease, esophagitis presence not specified - CMP14+EGFR  3. Hypothyroidism, unspecified hypothyroidism type - CMP14+EGFR - Thyroid Panel With TSH  4. Hyperlipidemia - CMP14+EGFR - Lipid panel  5. Vitamin D deficiency - CMP14+EGFR  6. Osteoporosis - CMP14+EGFR  7. Rheumatoid arthritis involving multiple sites with positive rheumatoid factor (HCC) - CMP14+EGFR  8. Onychomycosis - terbinafine (LAMISIL) 250 MG tablet; Take 1 tablet (250 mg total) by mouth daily.  Dispense: 84 tablet; Refill: 0   Continue all meds Labs pending Health Maintenance reviewed Diet and exercise encouraged RTO 6 months  [name], FNP

## 2016-01-24 NOTE — Patient Instructions (Signed)
Iron-Rich Diet Iron is a mineral that helps your body to produce hemoglobin. Hemoglobin is a protein in your red blood cells that carries oxygen to your body's tissues. Eating too little iron may cause you to feel weak and tired, and it can increase your risk for infection. Eating enough iron is necessary for your body's metabolism, muscle function, and nervous system. Iron is naturally found in many foods. It can also be added to foods or fortified in foods. There are two types of dietary iron:  Heme iron. Heme iron is absorbed by the body more easily than nonheme iron. Heme iron is found in meat, poultry, and fish.  Nonheme iron. Nonheme iron is found in dietary supplements, iron-fortified grains, beans, and vegetables. You may need to follow an iron-rich diet if:  You have been diagnosed with iron deficiency or iron-deficiency anemia.  You have a condition that prevents you from absorbing dietary iron, such as:  Infection in your intestines.  Celiac disease. This involves long-lasting (chronic) inflammation of your intestines.  You do not eat enough iron.  You eat a diet that is high in foods that impair iron absorption.  You have lost a lot of blood.  You have heavy bleeding during your menstrual cycle.  You are pregnant. WHAT IS MY PLAN? Your health care provider may help you to determine how much iron you need per day based on your condition. Generally, when a person consumes sufficient amounts of iron in the diet, the following iron needs are met:  Men.  35-85 years old: 11 mg per day.  43-3 years old: 8 mg per day.  Women.   55-40 years old: 15 mg per day.  61-47 years old: 18 mg per day.  Over 53 years old: 8 mg per day.  Pregnant women: 27 mg per day.  Breastfeeding women: 9 mg per day. WHAT DO I NEED TO KNOW ABOUT AN IRON-RICH DIET?  Eat fresh fruits and vegetables that are high in vitamin C along with foods that are high in iron. This will help increase  the amount of iron that your body absorbs from food, especially with foods containing nonheme iron. Foods that are high in vitamin C include oranges, peppers, tomatoes, and mango.  Take iron supplements only as directed by your health care provider. Overdose of iron can be life-threatening. If you were prescribed iron supplements, take them with orange juice or a vitamin C supplement.  Cook foods in pots and pans that are made from iron.   Eat nonheme iron-containing foods alongside foods that are high in heme iron. This helps to improve your iron absorption.   Certain foods and drinks contain compounds that impair iron absorption. Avoid eating these foods in the same meal as iron-rich foods or with iron supplements. These include:  Coffee, black tea, and red wine.  Milk, dairy products, and foods that are high in calcium.  Beans, soybeans, and peas.  Whole grains.  When eating foods that contain both nonheme iron and compounds that impair iron absorption, follow these tips to absorb iron better.   Soak beans overnight before cooking.  Soak whole grains overnight and drain them before using.  Ferment flours before baking, such as using yeast in bread dough. WHAT FOODS CAN I EAT? Grains Iron-fortified breakfast cereal. Iron-fortified whole-wheat bread. Enriched rice. Sprouted grains. Vegetables Spinach. Potatoes with skin. Green peas. Broccoli. Red and green bell peppers. Fermented vegetables. Fruits Prunes. Raisins. Oranges. Strawberries. Mango. Grapefruit. Meats and Other Protein  Sources Beef liver. Oysters. Beef. Shrimp. [place]. Chicken. [place]. Sardines. Chickpeas. Nuts. Tofu. Beverages Tomato juice. Fresh orange juice. Prune juice. Hibiscus tea. Fortified instant breakfast shakes. Condiments Tahini. Fermented soy sauce. Sweets and Desserts Black-strap molasses.  Other Wheat germ. The items listed above may not be a complete list of recommended foods or  beverages. Contact your dietitian for more options. WHAT FOODS ARE NOT RECOMMENDED? Grains Whole grains. Bran cereal. Bran flour. Oats. Vegetables Artichokes. Brussels sprouts. Kale. Fruits Blueberries. Raspberries. Strawberries. Figs. Meats and Other Protein Sources Soybeans. Products made from soy protein. Dairy Milk. Cream. Cheese. Yogurt. Cottage cheese. Beverages Coffee. Black tea. Red wine. Sweets and Desserts Cocoa. Chocolate. Ice cream. Other Basil. Oregano. Parsley. The items listed above may not be a complete list of foods and beverages to avoid. Contact your dietitian for more information.   This information is not intended to replace advice given to you by your health care provider. Make sure you discuss any questions you have with your health care provider.   Document Released: 02/13/2005 Document Revised: 07/23/2014 Document Reviewed: 01/27/2014 Elsevier Interactive Patient Education [employer].

## 2016-01-25 LAB — CMP14+EGFR
A/G RATIO: 1.1 — AB (ref 1.2–2.2)
ALK PHOS: 66 IU/L (ref 39–117)
ALT: 10 IU/L (ref 0–32)
AST: 20 IU/L (ref 0–40)
Albumin: 4 g/dL (ref 3.5–4.7)
BUN/Creatinine Ratio: 16 (ref 12–28)
BUN: 13 mg/dL (ref 8–27)
Bilirubin Total: 0.4 mg/dL (ref 0.0–1.2)
CALCIUM: 9.3 mg/dL (ref 8.7–10.3)
CO2: 28 mmol/L (ref 18–29)
CREATININE: 0.79 mg/dL (ref 0.57–1.00)
Chloride: 96 mmol/L (ref 96–106)
GFR calc Af Amer: 81 mL/min/{1.73_m2} (ref >59)
GFR calc non Af Amer: 70 mL/min/{1.73_m2} (ref >59)
GLOBULIN, TOTAL: 3.5 g/dL (ref 1.5–4.5)
Glucose: 85 mg/dL (ref 65–99)
POTASSIUM: 3.9 mmol/L (ref 3.5–5.2)
SODIUM: 140 mmol/L (ref 134–144)
Total Protein: 7.5 g/dL (ref 6.0–8.5)

## 2016-01-25 LAB — THYROID PANEL WITH TSH
Free Thyroxine Index: 2.2 (ref 1.2–4.9)
T3 UPTAKE RATIO: 28 % (ref 24–39)
T4 TOTAL: 7.7 ug/dL (ref 4.5–12.0)
TSH: 2.17 u[IU]/mL (ref 0.450–4.500)

## 2016-01-25 LAB — LIPID PANEL
CHOLESTEROL TOTAL: 288 mg/dL — AB (ref 100–199)
Chol/HDL Ratio: 3.5 ratio units (ref 0.0–4.4)
HDL: 82 mg/dL (ref >39)
LDL Calculated: 174 mg/dL — ABNORMAL HIGH (ref 0–99)
TRIGLYCERIDES: 162 mg/dL — AB (ref 0–149)
VLDL Cholesterol Cal: 32 mg/dL (ref 5–40)

## 2016-02-17 ENCOUNTER — Encounter: Payer: Self-pay | Admitting: Family

## 2016-02-17 ENCOUNTER — Ambulatory Visit (INDEPENDENT_AMBULATORY_CARE_PROVIDER_SITE_OTHER): Payer: Medicare Other | Admitting: Family

## 2016-02-17 VITALS — BP 134/76 | HR 66 | Temp 98.0°F | Ht <= 58 in | Wt 116.2 lb

## 2016-02-17 DIAGNOSIS — Z1211 Encounter for screening for malignant neoplasm of colon: Secondary | ICD-10-CM | POA: Diagnosis not present

## 2016-02-17 DIAGNOSIS — B360 Pityriasis versicolor: Secondary | ICD-10-CM

## 2016-02-17 MED ORDER — KETOCONAZOLE 2 % EX CREA
1.0000 | TOPICAL_CREAM | Freq: Two times a day (BID) | CUTANEOUS | 0 refills | Status: DC
Start: 2016-02-17 — End: 2016-03-22

## 2016-02-17 MED ORDER — KETOCONAZOLE 2 % EX CREA: TOPICAL_CREAM | Freq: Two times a day (BID) | CUTANEOUS | Status: DC

## 2016-02-17 NOTE — Progress Notes (Signed)
   Subjective:    Patient ID: Ashley Nelson, female    DOB: 01/27/1933, 82 y.o.   MRN: [id number]  Rash  This is a new problem. The current episode started more than 1 month ago. The problem has been waxing and waning since onset. The affected locations include the right arm and left arm. The rash is characterized by itchiness. She was exposed to nothing. Pertinent negatives include no cough, diarrhea, eye pain, facial edema, fever, rhinorrhea, shortness of breath or sore throat. Past treatments include anti-itch cream. The treatment provided mild relief.      Review of Systems  Constitutional: Negative.  Negative for fever.  HENT: Negative.  Negative for rhinorrhea and sore throat.   Eyes: Negative.  Negative for pain.  Respiratory: Negative.  Negative for cough and shortness of breath.   Cardiovascular: Negative.  Negative for palpitations.  Gastrointestinal: Negative.  Negative for diarrhea.  Endocrine: Negative.   Genitourinary: Negative.   Musculoskeletal: Negative.   Skin: Positive for rash.  Neurological: Negative.  Negative for headaches.  Hematological: Negative.   Psychiatric/Behavioral: Negative.   All other systems reviewed and are negative.      Objective:   Physical Exam  Constitutional: She is oriented to person, place, and time. She appears well-developed and well-nourished. No distress.  HENT:  Head: Normocephalic.  Neck: No thyromegaly present.  Cardiovascular: Normal rate, regular rhythm, normal heart sounds and intact distal pulses.   No murmur heard. Pulmonary/Chest: Effort normal and breath sounds normal. No respiratory distress. She has no wheezes.  Abdominal: Soft. Bowel sounds are normal. She exhibits no distension. There is no tenderness.  Musculoskeletal: Normal range of motion. She exhibits no edema or tenderness.  Neurological: She is alert and oriented to person, place, and time.  Skin: Skin is warm and dry. Rash noted.  Discoloration with  hypopigmented of bilateral arms  Psychiatric: She has a normal mood and affect. Her behavior is normal. Judgment and thought content normal.  Vitals reviewed.   BP 134/76   Pulse 66   Temp 98 F (36.7 C) (Oral)   Ht 4\' 10"  (1.473 m)   Wt 116 lb 3.2 oz (52.7 kg)   BMI 24.29 kg/m        Assessment & Plan:  1. Pityriasis versicolor - ketoconazole (NIZORAL) 2 % cream; Apply topically 2 (two) times daily.  2. Tinea versicolor  Do not scratch Avoid hot and humid conditions Apply dandruff shampoo as needed RTO prn  [name], FNP

## 2016-02-17 NOTE — Addendum Note (Signed)
Addended by: [name] A on: 02/17/2016 11:39 AM   Modules accepted: Orders

## 2016-02-17 NOTE — Patient Instructions (Signed)
Tinea Versicolor Tinea versicolor is a common fungal infection of the skin. It causes a rash that appears as light or dark patches on the skin. The rash most often occurs on the chest, back, neck, or upper arms. This condition is more common during warm weather. Other than affecting how your skin looks, tinea versicolor usually does not cause other problems. In most cases, the infection goes away in a few weeks with treatment. It may take a few months for the patches on your skin to clear up. CAUSES Tinea versicolor occurs when a type of fungus that is normally present on the skin starts to overgrow. This fungus is a kind of yeast. The exact cause of the overgrowth is not known. This condition cannot be passed from one person to another (noncontagious). RISK FACTORS This condition is more likely to develop when certain factors are present, such as:  Heat and humidity.  Sweating too much.  Hormone changes.  Oily skin.  A weak defense (immune) system. SYMPTOMS Symptoms of this condition may include:  A rash on your skin that is made up of light or dark patches. The rash may have:  Patches of tan or pink spots on light skin.  Patches of white or brown spots on dark skin.  Patches of skin that do not tan.  Well-marked edges.  Scales on the discolored areas.  Mild itching. DIAGNOSIS A health care provider can usually diagnose this condition by looking at your skin. During the exam, he or she may use ultraviolet light to help determine the extent of the infection. In some cases, a skin sample may be taken by scraping the rash. This sample will be viewed under a microscope to check for yeast overgrowth. TREATMENT Treatment for this condition may include:  Dandruff shampoo that is applied to the affected skin during showers or bathing.  Over-the-counter medicated skin cream, lotion, or soaps.  Prescription antifungal medicine in the form of skin cream or pills.  Medicine to help  reduce itching. HOME CARE INSTRUCTIONS  Take medicines only as directed by your health care provider.  Apply dandruff shampoo to the affected area if told to do so by your health care provider. You may be instructed to scrub the affected skin for several minutes each day.  Do not scratch the affected area of skin.  Avoid hot and humid conditions.  Do not use tanning booths.  Try to avoid sweating a lot. SEEK MEDICAL CARE IF:  Your symptoms get worse.  You have a fever.  You have redness, swelling, or pain at the site of your rash.  You have fluid, blood, or pus coming from your rash.  Your rash returns after treatment.   This information is not intended to replace advice given to you by your health care provider. Make sure you discuss any questions you have with your health care provider.   Document Released: 06/29/2000 Document Revised: 07/23/2014 Document Reviewed: 04/13/2014 Elsevier Interactive Patient Education 2016 [employer]  

## 2016-02-17 NOTE — Addendum Note (Signed)
Addended by: [name] A on: 02/17/2016 11:53 AM   Modules accepted: Orders

## 2016-03-22 ENCOUNTER — Encounter: Payer: Self-pay | Admitting: Family Medicine

## 2016-03-22 ENCOUNTER — Ambulatory Visit (INDEPENDENT_AMBULATORY_CARE_PROVIDER_SITE_OTHER): Payer: Medicare Other | Admitting: Family Medicine

## 2016-03-22 VITALS — BP 135/74 | HR 59 | Temp 97.5°F | Ht <= 58 in | Wt 114.8 lb

## 2016-03-22 DIAGNOSIS — B360 Pityriasis versicolor: Secondary | ICD-10-CM

## 2016-03-22 MED ORDER — KETOCONAZOLE 2 % EX CREA: 1.0000 "application " | TOPICAL_CREAM | Freq: Two times a day (BID) | CUTANEOUS | 0 refills | Status: DC

## 2016-03-22 NOTE — Progress Notes (Signed)
BP 135/74   Pulse (!) 59   Temp 97.5 F (36.4 C) (Oral)   Ht 4\' 10"  (1.473 m)   Wt 114 lb 12.8 oz (52.1 kg)   BMI 23.99 kg/m    Subjective:    Patient ID: Ashley Nelson, female    DOB: 1932-08-27, 82 y.o.   MRN: [id number]  HPI: Ashley Nelson is a 82 y.o. female presenting on 03/22/2016 for Rash on both arms (using Ketoconazole cream, has been here several times for same thing)   HPI Rash Patient is coming in because she is still having a rash on her arms which is hypopigmentation. She denies any pruritus or scaling or flaking. She has been using antifungal topical cream and an oral pill of terbinafine for the past month and was concerned because it is not improving yet. She denies any fevers or chills or any spreading of the rash.  Relevant past medical, surgical, family and social history reviewed and updated as indicated. Interim medical history since our last visit reviewed. Allergies and medications reviewed and updated.  Review of Systems  Constitutional: Negative for chills and fever.  HENT: Negative for congestion, ear discharge and ear pain.   Respiratory: Negative for chest tightness and shortness of breath.   Cardiovascular: Negative for chest pain and leg swelling.  Genitourinary: Negative for difficulty urinating and dysuria.  Musculoskeletal: Negative for back pain and gait problem.  Skin: Positive for color change and rash.  Neurological: Negative for light-headedness and headaches.  Psychiatric/Behavioral: Negative for agitation and behavioral problems.  All other systems reviewed and are negative.   Per HPI unless specifically indicated above     Objective:    BP 135/74   Pulse (!) 59   Temp 97.5 F (36.4 C) (Oral)   Ht 4\' 10"  (1.473 m)   Wt 114 lb 12.8 oz (52.1 kg)   BMI 23.99 kg/m   Wt Readings from Last 3 Encounters:  03/22/16 114 lb 12.8 oz (52.1 kg)  02/17/16 116 lb 3.2 oz (52.7 kg)  01/24/16 119 lb 3.2 oz (54.1 kg)    Physical Exam    Constitutional: She is oriented to person, place, and time. She appears well-developed and well-nourished. No distress.  Eyes: Conjunctivae are normal.  Cardiovascular: Normal rate, regular rhythm, normal heart sounds and intact distal pulses.   No murmur heard. Pulmonary/Chest: Effort normal and breath sounds normal. No respiratory distress. She has no wheezes.  Musculoskeletal: Normal range of motion. She exhibits no edema or tenderness.  Neurological: She is alert and oriented to person, place, and time. Coordination normal.  Skin: Skin is warm and dry. Rash noted. Rash is macular (Hypopigmented macular patches on both arms.). She is not diaphoretic.  Psychiatric: She has a normal mood and affect. Her behavior is normal.  Nursing note and vitals reviewed.      Assessment & Plan:   Problem List Items Addressed This Visit    None    Visit Diagnoses    Pityriasis versicolor    -  Primary   Has not improved yet and patient was concerned. Reassured her and just takes a lot more time for the pigment to come back   Relevant Medications   ketoconazole (NIZORAL) 2 % cream       Follow up plan: Return if symptoms worsen or fail to improve.  Counseling provided for all of the vaccine components No orders of the defined types were placed in this encounter.   Caryl Pina, MD Tristan Schroeder  [place] 03/22/2016, 10:13 AM

## 2016-03-29 DIAGNOSIS — R768 Other specified abnormal immunological findings in serum: Secondary | ICD-10-CM | POA: Diagnosis not present

## 2016-03-29 DIAGNOSIS — M0589 Other rheumatoid arthritis with rheumatoid factor of multiple sites: Secondary | ICD-10-CM | POA: Diagnosis not present

## 2016-03-29 DIAGNOSIS — M2550 Pain in unspecified joint: Secondary | ICD-10-CM | POA: Diagnosis not present

## 2016-03-29 DIAGNOSIS — Z79899 Other long term (current) drug therapy: Secondary | ICD-10-CM | POA: Diagnosis not present

## 2016-04-13 ENCOUNTER — Other Ambulatory Visit: Payer: Self-pay | Admitting: Family

## 2016-04-13 ENCOUNTER — Other Ambulatory Visit: Payer: Self-pay | Admitting: Family Medicine

## 2016-04-13 DIAGNOSIS — B360 Pityriasis versicolor: Secondary | ICD-10-CM

## 2016-04-13 DIAGNOSIS — B351 Tinea unguium: Secondary | ICD-10-CM

## 2016-04-13 DIAGNOSIS — E039 Hypothyroidism, unspecified: Secondary | ICD-10-CM

## 2016-04-30 ENCOUNTER — Ambulatory Visit (INDEPENDENT_AMBULATORY_CARE_PROVIDER_SITE_OTHER): Payer: Medicare Other | Admitting: Family

## 2016-04-30 ENCOUNTER — Encounter: Payer: Self-pay | Admitting: Family

## 2016-04-30 VITALS — BP 146/75 | HR 64 | Temp 97.1°F | Ht <= 58 in | Wt 114.4 lb

## 2016-04-30 DIAGNOSIS — M25511 Pain in right shoulder: Secondary | ICD-10-CM

## 2016-04-30 NOTE — Progress Notes (Signed)
   Subjective:    Patient ID: Ashley Nelson, female    DOB: 11/19/1932, 82 y.o.   MRN: [id number]  HPI  Pt presents to the office today with right shoulder pain. PT states the pain has resolved at this time, but was worried. PT states last Tuesday you woke up with aching pain of 8 out 10 in her right shoulder and then it resolved by that evening. PT has spoken to several family members who told her she need to see her PCP. PT denies any SOB, chest pain, edema, or palpitations at this time.    Review of Systems  Constitutional: Negative.   HENT: Negative.   Eyes: Negative.   Respiratory: Negative.  Negative for shortness of breath.   Cardiovascular: Negative.  Negative for palpitations.  Gastrointestinal: Negative.   Endocrine: Negative.   Genitourinary: Negative.   Musculoskeletal: Negative.   Neurological: Negative.  Negative for headaches.  Hematological: Negative.   Psychiatric/Behavioral: Negative.   All other systems reviewed and are negative.      Objective:   Physical Exam  Constitutional: She is oriented to person, place, and time. She appears well-developed and well-nourished. No distress.  HENT:  Head: Normocephalic and atraumatic.  Right Ear: External ear normal.  Left Ear: External ear normal.  Eyes: Pupils are equal, round, and reactive to light.  Neck: Normal range of motion. Neck supple. No thyromegaly present.  Cardiovascular: Normal rate, regular rhythm, normal heart sounds and intact distal pulses.   No murmur heard. Pulmonary/Chest: Effort normal and breath sounds normal. No respiratory distress. She has no wheezes.  Abdominal: Soft. Bowel sounds are normal. She exhibits no distension. There is no tenderness.  Musculoskeletal: Normal range of motion. She exhibits no edema or tenderness.  Neurological: She is alert and oriented to person, place, and time.  Skin: Skin is warm and dry.  Psychiatric: She has a normal mood and affect. Her behavior is normal.  Judgment and thought content normal.  Vitals reviewed.     BP (!) 146/75   Pulse 64   Temp 97.1 F (36.2 C) (Oral)   Ht 4\' 10"  (1.473 m)   Wt 114 lb 6.4 oz (51.9 kg)   BMI 23.91 kg/m      Assessment & Plan:  1. Acute pain of right shoulder -Resolved, I believe this was related to "sleeping wrong". Pain has resolved, no chest pain or SOB. Pt walks 2-3 miles a day. -If pt has any chest pain or SOB RTO or go to ED RTO prn   [name], FNP

## 2016-04-30 NOTE — Patient Instructions (Signed)

## 2016-06-28 DIAGNOSIS — M0589 Other rheumatoid arthritis with rheumatoid factor of multiple sites: Secondary | ICD-10-CM | POA: Diagnosis not present

## 2016-06-28 DIAGNOSIS — Z79899 Other long term (current) drug therapy: Secondary | ICD-10-CM | POA: Diagnosis not present

## 2016-07-31 ENCOUNTER — Ambulatory Visit: Payer: Medicare Other | Admitting: Family

## 2016-09-20 ENCOUNTER — Ambulatory Visit (INDEPENDENT_AMBULATORY_CARE_PROVIDER_SITE_OTHER): Payer: Medicare Other | Admitting: Family

## 2016-09-20 ENCOUNTER — Encounter: Payer: Self-pay | Admitting: Family

## 2016-09-20 VITALS — BP 133/71 | HR 75 | Temp 97.6°F | Ht <= 58 in | Wt 121.0 lb

## 2016-09-20 DIAGNOSIS — E039 Hypothyroidism, unspecified: Secondary | ICD-10-CM

## 2016-09-20 DIAGNOSIS — E785 Hyperlipidemia, unspecified: Secondary | ICD-10-CM

## 2016-09-20 DIAGNOSIS — H8110 Benign paroxysmal vertigo, unspecified ear: Secondary | ICD-10-CM | POA: Diagnosis not present

## 2016-09-20 DIAGNOSIS — K219 Gastro-esophageal reflux disease without esophagitis: Secondary | ICD-10-CM

## 2016-09-20 DIAGNOSIS — M0579 Rheumatoid arthritis with rheumatoid factor of multiple sites without organ or systems involvement: Secondary | ICD-10-CM

## 2016-09-20 DIAGNOSIS — M810 Age-related osteoporosis without current pathological fracture: Secondary | ICD-10-CM | POA: Diagnosis not present

## 2016-09-20 DIAGNOSIS — I10 Essential (primary) hypertension: Secondary | ICD-10-CM | POA: Diagnosis not present

## 2016-09-20 DIAGNOSIS — E559 Vitamin D deficiency, unspecified: Secondary | ICD-10-CM

## 2016-09-20 MED ORDER — MECLIZINE HCL 25 MG PO TABS: 25.0000 mg | ORAL_TABLET | Freq: Three times a day (TID) | ORAL | 0 refills | Status: DC | PRN

## 2016-09-20 MED ORDER — HYDROCHLOROTHIAZIDE 12.5 MG PO TABS: 12.5000 mg | ORAL_TABLET | Freq: Every day | ORAL | 3 refills | Status: DC

## 2016-09-20 NOTE — Progress Notes (Signed)
Subjective:    Patient ID: Ashley Nelson, female    DOB: [birth year], 83 y.o.   MRN: [id number]  Pt presents to the office today for chronic follow up. Pt has rheumatoid arthritis and is followed by Rheumatologists every 3 months. PT currently taking methotrexate.  Hypertension  This is a chronic problem. The current episode started more than 1 year ago. The problem has been resolved since onset. The problem is controlled. Pertinent negatives include no anxiety, headaches, malaise/fatigue, palpitations, peripheral edema or shortness of breath. Risk factors for coronary artery disease include dyslipidemia, post-menopausal state and stress. Past treatments include diuretics. The current treatment provides mild improvement. There is no history of kidney disease, CAD/MI, CVA or heart failure. Identifiable causes of hypertension include a thyroid problem.  Gastroesophageal Reflux  She complains of nausea. She reports no belching, no coughing, no heartburn or no sore throat. This is a chronic problem. The current episode started more than 1 year ago. The problem occurs rarely. The problem has been resolved. The symptoms are aggravated by certain foods. Pertinent negatives include no muscle weakness. She has tried a diet change for the symptoms. The treatment provided significant relief.  Thyroid Problem  Presents for follow-up visit. Symptoms include constipation and dry skin. Patient reports no cold intolerance, diarrhea, leg swelling or palpitations. The symptoms have been stable. Past treatments include levothyroxine. The treatment provided moderate relief. Her past medical history is significant for hyperlipidemia. There is no history of heart failure.  Hyperlipidemia  This is a chronic problem. The current episode started more than 1 year ago. The problem is uncontrolled. Recent lipid tests were reviewed and are high. Pertinent negatives include no shortness of breath. Current antihyperlipidemic  treatment includes diet change. The current treatment provides no improvement of lipids. Risk factors for coronary artery disease include dyslipidemia, family history and hypertension.  Dizziness  This is a new problem. The current episode started in the past 7 days. The problem occurs intermittently. The problem has been waxing and waning. Associated symptoms include nausea and vertigo. Pertinent negatives include no coughing, headaches or sore throat. The symptoms are aggravated by bending. She has tried nothing for the symptoms. The treatment provided no relief.  Osteoporosis Pt's last Dexa Scan was on 03/29/15. Pt encouraged to restart calcium and vit d supplements.     Review of Systems  Constitutional: Negative.  Negative for malaise/fatigue.  HENT: Negative.  Negative for sore throat.   Eyes: Negative.   Respiratory: Negative.  Negative for cough and shortness of breath.   Cardiovascular: Negative.  Negative for palpitations.  Gastrointestinal: Positive for constipation and nausea. Negative for diarrhea and heartburn.  Endocrine: Negative.  Negative for cold intolerance.  Genitourinary: Negative.   Musculoskeletal: Negative.  Negative for muscle weakness.  Neurological: Positive for dizziness and vertigo. Negative for headaches.  Hematological: Negative.   Psychiatric/Behavioral: Negative.   All other systems reviewed and are negative.      Objective:   Physical Exam  Constitutional: She is oriented to person, place, and time. She appears well-developed and well-nourished. No distress.  HENT:  Head: Normocephalic and atraumatic.  Right Ear: External ear normal.  Left Ear: External ear normal.  Nose: Nose normal.  Mouth/Throat: Oropharynx is clear and moist.  Eyes: Pupils are equal, round, and reactive to light.  Neck: Normal range of motion. Neck supple. No thyromegaly present.  Cardiovascular: Normal rate, regular rhythm, normal heart sounds and intact distal pulses.   No  murmur heard. Pulmonary/Chest: Effort  normal and breath sounds normal. No respiratory distress. She has no wheezes.  Abdominal: Soft. Bowel sounds are normal. She exhibits no distension. There is no tenderness.  Musculoskeletal: Normal range of motion. She exhibits no edema or tenderness.  Neurological: She is alert and oriented to person, place, and time. She has normal reflexes. No cranial nerve deficit.  Skin: Skin is warm and dry.  All toenails with fungus and are thick and discolored   Psychiatric: She has a normal mood and affect. Her behavior is normal. Judgment and thought content normal.  Vitals reviewed.     BP (!) 142/81   Pulse 88   Temp 97.6 F (36.4 C) (Oral)   Ht 4' 10"  (1.473 m)   Wt 121 lb (54.9 kg)   BMI 25.29 kg/m      Assessment & Plan:  1. Essential hypertension - CMP14+EGFR  2. Gastroesophageal reflux disease, esophagitis presence not specified - CMP14+EGFR  3. Hypothyroidism, unspecified type - CMP14+EGFR - Thyroid Panel With TSH  4. Hyperlipidemia, unspecified hyperlipidemia type - CMP14+EGFR  5. Vitamin D deficiency - CMP14+EGFR  6. Rheumatoid arthritis involving multiple sites with positive rheumatoid factor (HCC) - CMP14+EGFR  7. Osteoporosis, unspecified osteoporosis type, unspecified pathological fracture presence - CMP14+EGFR  8. Benign paroxysmal positional vertigo, unspecified laterality -Falls precaution discussed -Antivert started today as needed - meclizine (ANTIVERT) 25 MG tablet; Take 1 tablet (25 mg total) by mouth 3 (three) times daily as needed for dizziness.  Dispense: 30 tablet; Refill: 0 -Anemia Profil B  Continue all meds Labs pending Health Maintenance reviewed Diet and exercise encouraged RTO 6 months   [name], FNP

## 2016-09-20 NOTE — Addendum Note (Signed)
Addended by: [name] A on: 09/20/2016 04:31 PM   Modules accepted: Orders

## 2016-09-20 NOTE — Patient Instructions (Addendum)

## 2016-09-21 LAB — ANEMIA PROFILE B
BASOS ABS: 0 10*3/uL (ref 0.0–0.2)
Basos: 1 %
EOS (ABSOLUTE): 0.2 10*3/uL (ref 0.0–0.4)
EOS: 2 %
Ferritin: 127 ng/mL (ref 15–150)
Folate: 17.4 ng/mL (ref >3.0)
HEMOGLOBIN: 11.9 g/dL (ref 11.1–15.9)
Hematocrit: 35.7 % (ref 34.0–46.6)
IMMATURE GRANS (ABS): 0 10*3/uL (ref 0.0–0.1)
IMMATURE GRANULOCYTES: 0 %
IRON SATURATION: 26 % (ref 15–55)
IRON: 83 ug/dL (ref 27–139)
LYMPHS: 36 %
Lymphocytes Absolute: 2.4 10*3/uL (ref 0.7–3.1)
MCH: 32.7 pg (ref 26.6–33.0)
MCHC: 33.3 g/dL (ref 31.5–35.7)
MCV: 98 fL — ABNORMAL HIGH (ref 79–97)
MONOCYTES: 9 %
Monocytes Absolute: 0.6 10*3/uL (ref 0.1–0.9)
NEUTROS PCT: 52 %
Neutrophils Absolute: 3.4 10*3/uL (ref 1.4–7.0)
PLATELETS: 236 10*3/uL (ref 150–379)
RBC: 3.64 x10E6/uL — ABNORMAL LOW (ref 3.77–5.28)
RDW: 14.4 % (ref 12.3–15.4)
RETIC CT PCT: 1.1 % (ref 0.6–2.6)
TIBC: 315 ug/dL (ref 250–450)
UIBC: 232 ug/dL (ref 118–369)
Vitamin B-12: 406 pg/mL (ref 232–1245)
WBC: 6.6 10*3/uL (ref 3.4–10.8)

## 2016-09-21 LAB — CMP14+EGFR
ALT: 11 IU/L (ref 0–32)
AST: 17 IU/L (ref 0–40)
Albumin/Globulin Ratio: 1.1 — ABNORMAL LOW (ref 1.2–2.2)
Albumin: 4 g/dL (ref 3.5–4.7)
Alkaline Phosphatase: 67 IU/L (ref 39–117)
BUN/Creatinine Ratio: 17 (ref 12–28)
BUN: 15 mg/dL (ref 8–27)
Bilirubin Total: 0.4 mg/dL (ref 0.0–1.2)
CALCIUM: 9 mg/dL (ref 8.7–10.3)
CO2: 35 mmol/L — AB (ref 18–29)
CREATININE: 0.86 mg/dL (ref 0.57–1.00)
Chloride: 95 mmol/L — ABNORMAL LOW (ref 96–106)
GFR calc Af Amer: 73 mL/min/{1.73_m2} (ref >59)
GFR calc non Af Amer: 64 mL/min/{1.73_m2} (ref >59)
GLOBULIN, TOTAL: 3.5 g/dL (ref 1.5–4.5)
Glucose: 78 mg/dL (ref 65–99)
Potassium: 3.7 mmol/L (ref 3.5–5.2)
SODIUM: 143 mmol/L (ref 134–144)
Total Protein: 7.5 g/dL (ref 6.0–8.5)

## 2016-09-21 LAB — THYROID PANEL WITH TSH
FREE THYROXINE INDEX: 2.1 (ref 1.2–4.9)
T3 Uptake Ratio: 29 % (ref 24–39)
T4 TOTAL: 7.3 ug/dL (ref 4.5–12.0)
TSH: 2.7 u[IU]/mL (ref 0.450–4.500)

## 2016-10-11 DIAGNOSIS — M2550 Pain in unspecified joint: Secondary | ICD-10-CM | POA: Diagnosis not present

## 2016-10-11 DIAGNOSIS — R768 Other specified abnormal immunological findings in serum: Secondary | ICD-10-CM | POA: Diagnosis not present

## 2016-10-11 DIAGNOSIS — M0589 Other rheumatoid arthritis with rheumatoid factor of multiple sites: Secondary | ICD-10-CM | POA: Diagnosis not present

## 2016-10-11 DIAGNOSIS — Z79899 Other long term (current) drug therapy: Secondary | ICD-10-CM | POA: Diagnosis not present

## 2016-12-01 DIAGNOSIS — H6981 Other specified disorders of Eustachian tube, right ear: Secondary | ICD-10-CM | POA: Diagnosis not present

## 2016-12-01 DIAGNOSIS — K1379 Other lesions of oral mucosa: Secondary | ICD-10-CM | POA: Diagnosis not present

## 2017-02-19 ENCOUNTER — Encounter: Payer: Self-pay | Admitting: Family

## 2017-02-19 ENCOUNTER — Ambulatory Visit (INDEPENDENT_AMBULATORY_CARE_PROVIDER_SITE_OTHER): Payer: Medicare Other | Admitting: Family

## 2017-02-19 VITALS — BP 142/73 | HR 68 | Temp 97.4°F | Ht <= 58 in | Wt 121.6 lb

## 2017-02-19 DIAGNOSIS — B351 Tinea unguium: Secondary | ICD-10-CM | POA: Diagnosis not present

## 2017-02-19 DIAGNOSIS — M79609 Pain in unspecified limb: Secondary | ICD-10-CM | POA: Diagnosis not present

## 2017-02-19 DIAGNOSIS — E039 Hypothyroidism, unspecified: Secondary | ICD-10-CM

## 2017-02-19 DIAGNOSIS — I10 Essential (primary) hypertension: Secondary | ICD-10-CM

## 2017-02-19 DIAGNOSIS — H8110 Benign paroxysmal vertigo, unspecified ear: Secondary | ICD-10-CM

## 2017-02-19 MED ORDER — HYDROCHLOROTHIAZIDE 12.5 MG PO TABS: 12.5000 mg | ORAL_TABLET | Freq: Every day | ORAL | 3 refills | Status: DC

## 2017-02-19 MED ORDER — LEVOTHYROXINE SODIUM 50 MCG PO TABS: 50.0000 ug | ORAL_TABLET | Freq: Every day | ORAL | 2 refills | Status: DC

## 2017-02-19 NOTE — Progress Notes (Signed)
   Subjective:    Patient ID: Ashley Nelson, female    DOB: 1932/09/27, 83 y.o.   MRN: [id number]   PT presents to the office today right great toenail pain that started several months. Pt states she got a pedicure about 4 weeks ago and states she had an ingrown toenail then. Pt states "she tried fixing it, but told me to see my doctor if it did not get better".   PT states the pain is intermittent pain of 6 out 10 and when it is touched the pain is worse.  Dizziness  This is a recurrent problem. The current episode started 1 to 4 weeks ago. The problem occurs intermittently. The symptoms are aggravated by bending. The treatment provided mild relief.      Review of Systems  Skin:       Right great toenail   Neurological: Positive for dizziness.  All other systems reviewed and are negative.      Objective:   Physical Exam  Constitutional: She is oriented to person, place, and time. She appears well-developed and well-nourished. No distress.  HENT:  Head: Normocephalic.  Cardiovascular: Normal rate, regular rhythm, normal heart sounds and intact distal pulses.   No murmur heard. Pulmonary/Chest: Effort normal and breath sounds normal. No respiratory distress. She has no wheezes.  Abdominal: Soft. Bowel sounds are normal. She exhibits no distension. There is no tenderness.  Musculoskeletal: Normal range of motion. She exhibits no edema or tenderness.  Neurological: She is alert and oriented to person, place, and time.  Skin: Skin is warm and dry.  Thick right great toenail and tenderness present lateral nail. No redness discharge or warmth present  Psychiatric: She has a normal mood and affect. Her behavior is normal. Judgment and thought content normal.  Vitals reviewed.     BP (!) 142/73   Pulse 68   Temp (!) 97.4 F (36.3 C) (Oral)   Ht 4\' 10"  (1.473 m)   Wt 121 lb 9.6 oz (55.2 kg)   BMI 25.41 kg/m      Assessment & Plan:  1. Pain due to onychomycosis of nail -  Ambulatory referral to Podiatry Discussed terbinafine is stilling working and nail needs to grow out PT was treated with terb  2. Benign paroxysmal positional vertigo, unspecified laterality Continue antivert Falls precautions discussed [place] exercises discussed-handout given      [name], FNP

## 2017-02-19 NOTE — Patient Instructions (Addendum)

## 2017-03-07 DIAGNOSIS — R768 Other specified abnormal immunological findings in serum: Secondary | ICD-10-CM | POA: Diagnosis not present

## 2017-03-07 DIAGNOSIS — M0589 Other rheumatoid arthritis with rheumatoid factor of multiple sites: Secondary | ICD-10-CM | POA: Diagnosis not present

## 2017-03-07 DIAGNOSIS — Z79899 Other long term (current) drug therapy: Secondary | ICD-10-CM | POA: Diagnosis not present

## 2017-03-07 DIAGNOSIS — M2550 Pain in unspecified joint: Secondary | ICD-10-CM | POA: Diagnosis not present

## 2017-03-12 DIAGNOSIS — B351 Tinea unguium: Secondary | ICD-10-CM | POA: Diagnosis not present

## 2017-03-12 DIAGNOSIS — M79676 Pain in unspecified toe(s): Secondary | ICD-10-CM | POA: Diagnosis not present

## 2017-03-25 ENCOUNTER — Ambulatory Visit: Payer: Medicare Other | Admitting: Family

## 2017-04-04 ENCOUNTER — Encounter: Payer: Self-pay | Admitting: Family

## 2017-04-04 ENCOUNTER — Ambulatory Visit (INDEPENDENT_AMBULATORY_CARE_PROVIDER_SITE_OTHER): Payer: Medicare Other | Admitting: Family

## 2017-04-04 VITALS — BP 135/79 | HR 66 | Temp 97.0°F | Ht <= 58 in | Wt 121.8 lb

## 2017-04-04 DIAGNOSIS — K219 Gastro-esophageal reflux disease without esophagitis: Secondary | ICD-10-CM | POA: Diagnosis not present

## 2017-04-04 DIAGNOSIS — I10 Essential (primary) hypertension: Secondary | ICD-10-CM | POA: Diagnosis not present

## 2017-04-04 DIAGNOSIS — E039 Hypothyroidism, unspecified: Secondary | ICD-10-CM | POA: Diagnosis not present

## 2017-04-04 DIAGNOSIS — M0579 Rheumatoid arthritis with rheumatoid factor of multiple sites without organ or systems involvement: Secondary | ICD-10-CM | POA: Diagnosis not present

## 2017-04-04 NOTE — Patient Instructions (Signed)
Hypothyroidism Hypothyroidism is a disorder of the thyroid. The thyroid is a large gland that is located in the lower front of the neck. The thyroid releases hormones that control how the body works. With hypothyroidism, the thyroid does not make enough of these hormones. What are the causes? Causes of hypothyroidism may include:  Viral infections.  Pregnancy.  Your own defense system (immune system) attacking your thyroid.  Certain medicines.  Birth defects.  Past radiation treatments to your head or neck.  Past treatment with radioactive iodine.  Past surgical removal of part or all of your thyroid.  Problems with the gland that is located in the center of your brain (pituitary).  What are the signs or symptoms? Signs and symptoms of hypothyroidism may include:  Feeling as though you have no energy (lethargy).  Inability to tolerate cold.  Weight gain that is not explained by a change in diet or exercise habits.  Dry skin.  Coarse hair.  Menstrual irregularity.  Slowing of thought processes.  Constipation.  Sadness or depression.  How is this diagnosed? Your health care provider may diagnose hypothyroidism with blood tests and ultrasound tests. How is this treated? Hypothyroidism is treated with medicine that replaces the hormones that your body does not make. After you begin treatment, it may take several weeks for symptoms to go away. Follow these instructions at home:  Take medicines only as directed by your health care provider.  If you start taking any new medicines, tell your health care provider.  Keep all follow-up visits as directed by your health care provider. This is important. As your condition improves, your dosage needs may change. You will need to have blood tests regularly so that your health care provider can watch your condition. Contact a health care provider if:  Your symptoms do not get better with treatment.  You are taking thyroid  replacement medicine and: ? You sweat excessively. ? You have tremors. ? You feel anxious. ? You lose weight rapidly. ? You cannot tolerate heat. ? You have emotional swings. ? You have diarrhea. ? You feel weak. Get help right away if:  You develop chest pain.  You develop an irregular heartbeat.  You develop a rapid heartbeat. This information is not intended to replace advice given to you by your health care provider. Make sure you discuss any questions you have with your health care provider. Document Released: 07/02/2005 Document Revised: 12/08/2015 Document Reviewed: 11/17/2013 Elsevier Interactive Patient Education  2017 [employer]  

## 2017-04-04 NOTE — Progress Notes (Signed)
   Subjective:    Patient ID: Ashley Nelson, female    DOB: Oct 18, 1932, 83 y.o.   MRN: [id number]  Pt presents to the office today for chronic follow up. Pt has rheumatoid arthritis and is followed by Rheumatologists every 3 months. PT currently taking methotrexate.  Hypertension  This is a chronic problem. The current episode started more than 1 year ago. The problem has been resolved since onset. The problem is controlled. Pertinent negatives include no chest pain, malaise/fatigue, peripheral edema or shortness of breath. Risk factors for coronary artery disease include post-menopausal state. The current treatment provides moderate improvement. There is no history of kidney disease, CAD/MI, CVA or heart failure. Identifiable causes of hypertension include a thyroid problem.  Gastroesophageal Reflux  She reports no belching, no chest pain, no dysphagia or no heartburn. This is a chronic problem. The problem occurs rarely. The problem has been resolved. The symptoms are aggravated by certain foods. She has tried an antacid for the symptoms. The treatment provided significant relief.  Thyroid Problem  Presents for follow-up visit. Symptoms include constipation. Patient reports no diarrhea, dry skin or heat intolerance. The symptoms have been stable. There is no history of heart failure.      Review of Systems  Constitutional: Negative for malaise/fatigue.  Respiratory: Negative for shortness of breath.   Cardiovascular: Negative for chest pain.  Gastrointestinal: Positive for constipation. Negative for diarrhea, dysphagia and heartburn.  Endocrine: Negative for heat intolerance.  All other systems reviewed and are negative.      Objective:   Physical Exam  Constitutional: She is oriented to person, place, and time. She appears well-developed and well-nourished. No distress.  HENT:  Head: Normocephalic and atraumatic.  Right Ear: External ear normal.  Nose: Nose normal.  Mouth/Throat:  Oropharynx is clear and moist.  Eyes: Pupils are equal, round, and reactive to light.  Neck: Normal range of motion. Neck supple. No thyromegaly present.  Cardiovascular: Normal rate, regular rhythm, normal heart sounds and intact distal pulses.   No murmur heard. Pulmonary/Chest: Effort normal and breath sounds normal. No respiratory distress. She has no wheezes.  Abdominal: Soft. Bowel sounds are normal. She exhibits no distension. There is no tenderness.  Musculoskeletal: Normal range of motion. She exhibits no edema or tenderness.  Neurological: She is alert and oriented to person, place, and time.  Skin: Skin is warm and dry.  Psychiatric: She has a normal mood and affect. Her behavior is normal. Judgment and thought content normal.  Vitals reviewed.     BP 135/79   Pulse 66   Temp (!) 97 F (36.1 C) (Oral)   Ht '4\' 10"'$  (1.473 m)   Wt 121 lb 12.8 oz (55.2 kg)   BMI 25.46 kg/m      Assessment & Plan:  1. Essential hypertension - CMP14+EGFR  2. Gastroesophageal reflux disease, esophagitis presence not specified - CMP14+EGFR  3. Hypothyroidism, unspecified type - CMP14+EGFR - TSH  4. Rheumatoid arthritis involving multiple sites with positive rheumatoid factor ([place]) - CMP14+EGFR   Continue all meds and keep rheumatologists appts  Labs pending Health Maintenance reviewed Diet and exercise encouraged RTO 6 months   [name], FNP

## 2017-04-05 LAB — CMP14+EGFR
ALBUMIN: 4.2 g/dL (ref 3.5–4.7)
ALT: 12 IU/L (ref 0–32)
AST: 19 IU/L (ref 0–40)
Albumin/Globulin Ratio: 1.3 (ref 1.2–2.2)
Alkaline Phosphatase: 63 IU/L (ref 39–117)
BUN / CREAT RATIO: 17 (ref 12–28)
BUN: 13 mg/dL (ref 8–27)
Bilirubin Total: 0.5 mg/dL (ref 0.0–1.2)
CALCIUM: 8.6 mg/dL — AB (ref 8.7–10.3)
CO2: 25 mmol/L (ref 20–29)
CREATININE: 0.78 mg/dL (ref 0.57–1.00)
Chloride: 102 mmol/L (ref 96–106)
GFR, EST AFRICAN AMERICAN: 82 mL/min/{1.73_m2} (ref >59)
GFR, EST NON AFRICAN AMERICAN: 71 mL/min/{1.73_m2} (ref >59)
GLUCOSE: 86 mg/dL (ref 65–99)
Globulin, Total: 3.3 g/dL (ref 1.5–4.5)
Potassium: 3.8 mmol/L (ref 3.5–5.2)
Sodium: 142 mmol/L (ref 134–144)
TOTAL PROTEIN: 7.5 g/dL (ref 6.0–8.5)

## 2017-04-05 LAB — TSH: TSH: 3.34 u[IU]/mL (ref 0.450–4.500)

## 2017-10-03 ENCOUNTER — Encounter: Payer: Self-pay | Admitting: Family

## 2017-10-03 ENCOUNTER — Ambulatory Visit (INDEPENDENT_AMBULATORY_CARE_PROVIDER_SITE_OTHER): Payer: Medicare Other | Admitting: Family

## 2017-10-03 VITALS — BP 118/73 | HR 82 | Temp 97.1°F | Ht <= 58 in | Wt 125.8 lb

## 2017-10-03 DIAGNOSIS — I10 Essential (primary) hypertension: Secondary | ICD-10-CM

## 2017-10-03 DIAGNOSIS — E785 Hyperlipidemia, unspecified: Secondary | ICD-10-CM | POA: Diagnosis not present

## 2017-10-03 DIAGNOSIS — M0579 Rheumatoid arthritis with rheumatoid factor of multiple sites without organ or systems involvement: Secondary | ICD-10-CM

## 2017-10-03 DIAGNOSIS — E039 Hypothyroidism, unspecified: Secondary | ICD-10-CM | POA: Diagnosis not present

## 2017-10-03 NOTE — Patient Instructions (Signed)
Fat and Cholesterol Restricted Diet Getting too much fat and cholesterol in your diet may cause health problems. Following this diet helps keep your fat and cholesterol at normal levels. This can keep you from getting sick. What types of fat should I choose?  Choose monosaturated and polyunsaturated fats. These are found in foods such as olive oil, canola oil, flaxseeds, walnuts, almonds, and seeds.  Eat more omega-3 fats. Good choices include salmon, mackerel, sardines, tuna, flaxseed oil, and ground flaxseeds.  Limit saturated fats. These are in animal products such as meats, butter, and cream. They can also be in plant products such as palm oil, palm kernel oil, and coconut oil.  Avoid foods with partially hydrogenated oils in them. These contain trans fats. Examples of foods that have trans fats are stick margarine, some tub margarines, cookies, crackers, and other baked goods. What general guidelines do I need to follow?  Check food labels. Look for the words "trans fat" and "saturated fat."  When preparing a meal: ? Fill half of your plate with vegetables and green salads. ? Fill one fourth of your plate with whole grains. Look for the word "whole" as the first word in the ingredient list. ? Fill one fourth of your plate with lean protein foods.  Eat more foods that have fiber, like apples, carrots, beans, peas, and barley.  Eat more home-cooked foods. Eat less at restaurants and buffets.  Limit or avoid alcohol.  Limit foods high in starch and sugar.  Limit fried foods.  Cook foods without frying them. Baking, boiling, grilling, and broiling are all great options.  Lose weight if you are overweight. Losing even a small amount of weight can help your overall health. It can also help prevent diseases such as diabetes and heart disease. What foods can I eat? Grains Whole grains, such as whole wheat or whole grain breads, crackers, cereals, and pasta. Unsweetened oatmeal,  bulgur, barley, quinoa, or brown rice. Corn or whole wheat flour tortillas. Vegetables Fresh or frozen vegetables (raw, steamed, roasted, or grilled). Green salads. Fruits All fresh, canned (in natural juice), or frozen fruits. Meat and Other Protein Products Ground beef (85% or leaner), grass-fed beef, or beef trimmed of fat. Skinless chicken or turkey. Ground chicken or turkey. Pork trimmed of fat. All fish and seafood. Eggs. Dried beans, peas, or lentils. Unsalted nuts or seeds. Unsalted canned or dry beans. Dairy Low-fat dairy products, such as skim or 1% milk, 2% or reduced-fat cheeses, low-fat ricotta or cottage cheese, or plain low-fat yogurt. Fats and Oils Tub margarines without trans fats. Light or reduced-fat mayonnaise and salad dressings. Avocado. Olive, canola, sesame, or safflower oils. Natural peanut or almond butter (choose ones without added sugar and oil). The items listed above may not be a complete list of recommended foods or beverages. Contact your dietitian for more options. What foods are not recommended? Grains White bread. White pasta. White rice. Cornbread. Bagels, pastries, and croissants. Crackers that contain trans fat. Vegetables White potatoes. Corn. Creamed or fried vegetables. Vegetables in a cheese sauce. Fruits Dried fruits. Canned fruit in light or heavy syrup. Fruit juice. Meat and Other Protein Products Fatty cuts of meat. Ribs, chicken wings, bacon, sausage, bologna, salami, chitterlings, fatback, hot dogs, bratwurst, and packaged luncheon meats. Liver and organ meats. Dairy Whole or 2% milk, cream, half-and-half, and cream cheese. Whole milk cheeses. Whole-fat or sweetened yogurt. Full-fat cheeses. Nondairy creamers and whipped toppings. Processed cheese, cheese spreads, or cheese curds. Sweets and Desserts Corn   syrup, sugars, honey, and molasses. Candy. Jam and jelly. Syrup. Sweetened cereals. Cookies, pies, cakes, donuts, muffins, and ice  cream. Fats and Oils Butter, stick margarine, lard, shortening, ghee, or bacon fat. Coconut, palm kernel, or palm oils. Beverages Alcohol. Sweetened drinks (such as sodas, lemonade, and fruit drinks or punches). The items listed above may not be a complete list of foods and beverages to avoid. Contact your dietitian for more information. This information is not intended to replace advice given to you by your health care provider. Make sure you discuss any questions you have with your health care provider. Document Released: 01/01/2012 Document Revised: 03/08/2016 Document Reviewed: 10/01/2013 Elsevier Interactive Patient Education  2018 [employer] Inc.  

## 2017-10-03 NOTE — Progress Notes (Signed)
Subjective:    Patient ID: Ashley Nelson, female    DOB: [birth year], 84 y.o.   MRN: [id number]  Pt presents to the office today for chronic follow up. Pt has rheumatoid arthritis and is followed by Rheumatologists every 6 months. PT currently taking methotrexate. Hypertension  This is a chronic problem. The current episode started more than 1 year ago. The problem has been resolved since onset. The problem is controlled. Pertinent negatives include no headaches, malaise/fatigue, peripheral edema or shortness of breath. Risk factors for coronary artery disease include dyslipidemia. The current treatment provides moderate improvement. There is no history of kidney disease, CAD/MI, CVA or heart failure. Identifiable causes of hypertension include a thyroid problem.  Arthritis  Presents for follow-up visit. She reports no pain or stiffness. The symptoms have been stable. Affected locations include the right MCP, left MCP, right knee and left knee. Her pain is at a severity of 3/10. Pertinent negatives include no diarrhea.  Thyroid Problem  Presents for follow-up visit. Symptoms include constipation. Patient reports no cold intolerance, diaphoresis, diarrhea or hoarse voice. The symptoms have been stable. Her past medical history is significant for hyperlipidemia. There is no history of heart failure.  Hyperlipidemia  This is a chronic problem. The current episode started more than 1 year ago. The problem is uncontrolled. Recent lipid tests were reviewed and are high. Pertinent negatives include no shortness of breath. Current antihyperlipidemic treatment includes diet change. The current treatment provides no improvement of lipids. Risk factors for coronary artery disease include dyslipidemia, hypertension and post-menopausal.      Review of Systems  Constitutional: Negative for diaphoresis and malaise/fatigue.  HENT: Negative for hoarse voice.   Respiratory: Negative for shortness of breath.     Gastrointestinal: Positive for constipation. Negative for diarrhea.  Endocrine: Negative for cold intolerance.  Musculoskeletal: Positive for arthritis. Negative for stiffness.  Neurological: Negative for headaches.  All other systems reviewed and are negative.      Objective:   Physical Exam  Constitutional: She is oriented to person, place, and time. She appears well-developed and well-nourished. No distress.  HENT:  Head: Normocephalic and atraumatic.  Right Ear: External ear normal.  Left Ear: External ear normal.  Nose: Nose normal.  Mouth/Throat: Oropharynx is clear and moist.  Eyes: Pupils are equal, round, and reactive to light.  Neck: Normal range of motion. Neck supple. No thyromegaly present.  Cardiovascular: Normal rate, regular rhythm, normal heart sounds and intact distal pulses.  No murmur heard. Pulmonary/Chest: Effort normal and breath sounds normal. No respiratory distress. She has no wheezes.  Abdominal: Soft. Bowel sounds are normal. She exhibits no distension. There is no tenderness.  Musculoskeletal: Normal range of motion. She exhibits no edema or tenderness.  Neurological: She is alert and oriented to person, place, and time.  Skin: Skin is warm and dry.  Psychiatric: She has a normal mood and affect. Her behavior is normal. Judgment and thought content normal.  Vitals reviewed.     BP 118/73   Pulse 82   Temp (!) 97.1 F (36.2 C) (Oral)   Ht 4' 10"  (1.473 m)   Wt 125 lb 12.8 oz (57.1 kg)   BMI 26.29 kg/m      Assessment & Plan:  1. Essential hypertension - CMP14+EGFR  2. Hypothyroidism, unspecified type - CMP14+EGFR - TSH  3. Rheumatoid arthritis involving multiple sites with positive rheumatoid factor ([facility]) - CMP14+EGFR  4. Hyperlipidemia, unspecified hyperlipidemia type - CMP14+EGFR - Lipid panel  Continue all meds Labs pending Health Maintenance reviewed Diet and exercise encouraged RTO [place], FNP

## 2017-10-04 ENCOUNTER — Other Ambulatory Visit: Payer: Self-pay | Admitting: Family

## 2017-10-04 LAB — CMP14+EGFR
A/G RATIO: 1.2 (ref 1.2–2.2)
ALT: 9 IU/L (ref 0–32)
AST: 18 IU/L (ref 0–40)
Albumin: 4.2 g/dL (ref 3.5–4.7)
Alkaline Phosphatase: 70 IU/L (ref 39–117)
BUN/Creatinine Ratio: 16 (ref 12–28)
BUN: 13 mg/dL (ref 8–27)
Bilirubin Total: 0.3 mg/dL (ref 0.0–1.2)
CO2: 25 mmol/L (ref 20–29)
Calcium: 9 mg/dL (ref 8.7–10.3)
Chloride: 99 mmol/L (ref 96–106)
Creatinine, Ser: 0.82 mg/dL (ref 0.57–1.00)
GFR, EST AFRICAN AMERICAN: 77 mL/min/{1.73_m2} (ref >59)
GFR, EST NON AFRICAN AMERICAN: 67 mL/min/{1.73_m2} (ref >59)
GLUCOSE: 83 mg/dL (ref 65–99)
Globulin, Total: 3.4 g/dL (ref 1.5–4.5)
Potassium: 4.1 mmol/L (ref 3.5–5.2)
Sodium: 141 mmol/L (ref 134–144)
TOTAL PROTEIN: 7.6 g/dL (ref 6.0–8.5)

## 2017-10-04 LAB — LIPID PANEL
CHOL/HDL RATIO: 3.4 ratio (ref 0.0–4.4)
Cholesterol, Total: 270 mg/dL — ABNORMAL HIGH (ref 100–199)
HDL: 79 mg/dL (ref >39)
LDL Calculated: 163 mg/dL — ABNORMAL HIGH (ref 0–99)
Triglycerides: 141 mg/dL (ref 0–149)
VLDL CHOLESTEROL CAL: 28 mg/dL (ref 5–40)

## 2017-10-04 LAB — TSH: TSH: 6.23 u[IU]/mL — AB (ref 0.450–4.500)

## 2017-10-04 MED ORDER — LEVOTHYROXINE SODIUM 75 MCG PO TABS: 75.0000 ug | ORAL_TABLET | Freq: Every day | ORAL | 1 refills | Status: DC

## 2017-10-08 ENCOUNTER — Encounter: Payer: Self-pay | Admitting: *Deleted

## 2017-10-24 ENCOUNTER — Ambulatory Visit (INDEPENDENT_AMBULATORY_CARE_PROVIDER_SITE_OTHER): Payer: Medicare Other | Admitting: Family

## 2017-10-24 ENCOUNTER — Encounter: Payer: Self-pay | Admitting: Family

## 2017-10-24 VITALS — BP 138/80 | HR 75 | Temp 97.2°F | Ht <= 58 in | Wt 123.0 lb

## 2017-10-24 DIAGNOSIS — R040 Epistaxis: Secondary | ICD-10-CM | POA: Diagnosis not present

## 2017-10-24 NOTE — Patient Instructions (Signed)
Nosebleed, Adult A nosebleed is when blood comes out of the nose. Nosebleeds are common. Usually, they are not a sign of a serious condition. Nosebleeds can happen if a small blood vessel in your nose starts to bleed or if the lining of your nose (mucous membrane) cracks. They are commonly caused by:  Allergies.  Colds.  Picking your nose.  Blowing your nose too hard.  An injury from sticking an object into your nose or getting hit in the nose.  Dry or cold air.  Less common causes of nosebleeds include:  Toxic fumes.  Something abnormal in the nose or in the air-filled spaces in the bones of the face (sinuses).  Growths in the nose, such as polyps.  Medicines or conditions that cause blood to clot slowly.  Certain illnesses or procedures that irritate or dry out the nasal passages.  Follow these instructions at home: When you have a nosebleed:  Sit down and tilt your head slightly forward.  Use a clean towel or tissue to pinch your nostrils under the bony part of your nose. After 10 minutes, let go of your nose and see if bleeding starts again. Do not release pressure before that time. If there is still bleeding, repeat the pinching and holding for 10 minutes until the bleeding stops.  Do not place tissues or gauze in the nose to stop bleeding.  Avoid lying down and avoid tilting your head backward. That may make blood collect in the throat and cause gagging or coughing.  Use a nasal spray decongestant to help with a nosebleed as told by your health care provider.  Do not use petroleum jelly or mineral oil in your nose. It can drip into your lungs. After a nosebleed:  Avoid blowing your nose or sniffing for a number of hours.  Avoid straining, lifting, or bending at the waist for several days. You may resume other normal activities as you are able.  Use saline spray or a humidifier as told by your health care provider.  Aspirinand blood thinners make bleeding more  likely. If you are prescribed these medicines and you suffer from nosebleeds: ? Ask your health care provider if you should stop taking the medicines or if you should adjust the dose. ? Do not stop taking medicines that your health care provider has recommended unless told by your health care provider.  If your nosebleed was caused by dry mucous membranes, use over-the-counter saline nasal spray or gel. This will keep the mucous membranes moist and allow them to heal. If you must use a lubricant: ? Choose one that is water-soluble. ? Use only as much as you need and use it only as often as needed. ? Do not lie down until several hours after you use it. Contact a health care provider if:  You have a fever.  You get nosebleeds often or more often than usual.  You bruise very easily.  You have a nosebleed from having something stuck in your nose.  You have bleeding in your mouth.  You vomit or cough up brown material.  You have a nosebleed after you start a new medicine. Get help right away if:  You have a nosebleed after a fall or a head injury.  Your nosebleed does not go away after 20 minutes.  You feel dizzy or weak.  You have unusual bleeding from other parts of your body.  You have unusual bruising on other parts of your body.  You become sweaty.    You vomit blood. This information is not intended to replace advice given to you by your health care provider. Make sure you discuss any questions you have with your health care provider. Document Released: 04/11/2005 Document Revised: 03/01/2016 Document Reviewed: 01/17/2016 Elsevier Interactive Patient Education  2018 [employer] Inc.  

## 2017-10-24 NOTE — Progress Notes (Signed)
   Subjective:    Patient ID: Ashley Nelson, female    DOB: [birth year], 84 y.o.   MRN: [id number]  Epistaxis   This is a new problem. The current episode started in the past 7 days. The problem has been resolved. The bleeding is associated with recent URI. She has tried pressure for the symptoms. The treatment provided moderate relief. There is no history of a bleeding disorder or frequent nosebleeds.      Review of Systems  HENT: Positive for nosebleeds.   All other systems reviewed and are negative.      Objective:   Physical Exam  Constitutional: She is oriented to person, place, and time. She appears well-developed and well-nourished. No distress.  HENT:  Head: Normocephalic and atraumatic.  Right Ear: External ear normal.  Left Ear: External ear normal.  Nose: Mucosal edema present.  Mouth/Throat: Oropharynx is clear and moist.  Eyes: Pupils are equal, round, and reactive to light.  Neck: Normal range of motion. Neck supple. No thyromegaly present.  Cardiovascular: Normal rate, regular rhythm, normal heart sounds and intact distal pulses.  No murmur heard. Pulmonary/Chest: Effort normal and breath sounds normal. No respiratory distress. She has no wheezes.  Abdominal: Soft. Bowel sounds are normal. She exhibits no distension. There is no tenderness.  Musculoskeletal: Normal range of motion. She exhibits no edema or tenderness.  Neurological: She is alert and oriented to person, place, and time.  Skin: Skin is warm and dry.  Psychiatric: She has a normal mood and affect. Her behavior is normal. Judgment and thought content normal.  Vitals reviewed.   BP 138/80   Pulse 75   Temp (!) 97.2 F (36.2 C) (Oral)   Ht 4\' 10"  (1.473 m)   Wt 123 lb (55.8 kg)   BMI 25.71 kg/m      Assessment & Plan:  1. Epistaxis -Resolved  Use humidifier  Saline gel as needed Do not blow nose when bleeding RTO prn    [name], FNP

## 2017-11-04 DIAGNOSIS — M5030 Other cervical disc degeneration, unspecified cervical region: Secondary | ICD-10-CM | POA: Diagnosis not present

## 2017-11-04 DIAGNOSIS — M542 Cervicalgia: Secondary | ICD-10-CM | POA: Diagnosis not present

## 2017-11-04 DIAGNOSIS — Z79899 Other long term (current) drug therapy: Secondary | ICD-10-CM | POA: Diagnosis not present

## 2017-11-04 DIAGNOSIS — M0589 Other rheumatoid arthritis with rheumatoid factor of multiple sites: Secondary | ICD-10-CM | POA: Diagnosis not present

## 2017-11-06 ENCOUNTER — Telehealth: Payer: Self-pay | Admitting: Family

## 2017-11-06 NOTE — Telephone Encounter (Signed)
Last labs 10-03-2017 faxed to [facility] [phone] for Dr. Dossie Der

## 2017-11-15 ENCOUNTER — Telehealth: Payer: Self-pay | Admitting: Family

## 2018-02-03 DIAGNOSIS — M5030 Other cervical disc degeneration, unspecified cervical region: Secondary | ICD-10-CM | POA: Diagnosis not present

## 2018-02-03 DIAGNOSIS — M0589 Other rheumatoid arthritis with rheumatoid factor of multiple sites: Secondary | ICD-10-CM | POA: Diagnosis not present

## 2018-02-03 DIAGNOSIS — Z79899 Other long term (current) drug therapy: Secondary | ICD-10-CM | POA: Diagnosis not present

## 2018-02-28 ENCOUNTER — Other Ambulatory Visit: Payer: Self-pay | Admitting: Family

## 2018-02-28 DIAGNOSIS — I10 Essential (primary) hypertension: Secondary | ICD-10-CM

## 2018-04-08 ENCOUNTER — Ambulatory Visit (INDEPENDENT_AMBULATORY_CARE_PROVIDER_SITE_OTHER): Payer: Medicare Other | Admitting: Family

## 2018-04-08 ENCOUNTER — Ambulatory Visit (INDEPENDENT_AMBULATORY_CARE_PROVIDER_SITE_OTHER): Payer: Medicare Other

## 2018-04-08 ENCOUNTER — Encounter: Payer: Self-pay | Admitting: Family

## 2018-04-08 ENCOUNTER — Encounter: Payer: Self-pay | Admitting: *Deleted

## 2018-04-08 VITALS — BP 128/73 | HR 79 | Temp 97.0°F | Ht <= 58 in | Wt 114.8 lb

## 2018-04-08 DIAGNOSIS — M0579 Rheumatoid arthritis with rheumatoid factor of multiple sites without organ or systems involvement: Secondary | ICD-10-CM

## 2018-04-08 DIAGNOSIS — E039 Hypothyroidism, unspecified: Secondary | ICD-10-CM | POA: Diagnosis not present

## 2018-04-08 DIAGNOSIS — M810 Age-related osteoporosis without current pathological fracture: Secondary | ICD-10-CM

## 2018-04-08 DIAGNOSIS — E559 Vitamin D deficiency, unspecified: Secondary | ICD-10-CM | POA: Diagnosis not present

## 2018-04-08 DIAGNOSIS — I10 Essential (primary) hypertension: Secondary | ICD-10-CM

## 2018-04-08 DIAGNOSIS — E785 Hyperlipidemia, unspecified: Secondary | ICD-10-CM

## 2018-04-08 DIAGNOSIS — K219 Gastro-esophageal reflux disease without esophagitis: Secondary | ICD-10-CM | POA: Diagnosis not present

## 2018-04-08 DIAGNOSIS — M8588 Other specified disorders of bone density and structure, other site: Secondary | ICD-10-CM | POA: Diagnosis not present

## 2018-04-08 NOTE — Patient Instructions (Signed)
Osteoporosis Osteoporosis is the thinning and loss of density in the bones. Osteoporosis makes the bones more brittle, fragile, and likely to break (fracture). Over time, osteoporosis can cause the bones to become so weak that they fracture after a simple fall. The bones most likely to fracture are the bones in the hip, wrist, and spine. What are the causes? The exact cause is not known. What increases the risk? Anyone can develop osteoporosis. You may be at greater risk if you have a family history of the condition or have poor nutrition. You may also have a higher risk if you are:  Female.  50 years old or older.  A smoker.  Not physically active.  White or Asian.  Slender.  What are the signs or symptoms? A fracture might be the first sign of the disease, especially if it results from a fall or injury that would not usually cause a bone to break. Other signs and symptoms include:  Low back and neck pain.  Stooped posture.  Height loss.  How is this diagnosed? To make a diagnosis, your health care provider may:  Take a medical history.  Perform a physical exam.  Order tests, such as: ? A bone mineral density test. ? A dual-energy X-ray absorptiometry test.  How is this treated? The goal of osteoporosis treatment is to strengthen your bones to reduce your risk of a fracture. Treatment may involve:  Making lifestyle changes, such as: ? Eating a diet rich in calcium. ? Doing weight-bearing and muscle-strengthening exercises. ? Stopping tobacco use. ? Limiting alcohol intake.  Taking medicine to slow the process of bone loss or to increase bone density.  Monitoring your levels of calcium and vitamin D.  Follow these instructions at home:  Include calcium and vitamin D in your diet. Calcium is important for bone health, and vitamin D helps the body absorb calcium.  Perform weight-bearing and muscle-strengthening exercises as directed by your health care  provider.  Do not use any tobacco products, including cigarettes, chewing tobacco, and electronic cigarettes. If you need help quitting, ask your health care provider.  Limit your alcohol intake.  Take medicines only as directed by your health care provider.  Keep all follow-up visits as directed by your health care provider. This is important.  Take precautions at home to lower your risk of falling, such as: ? Keeping rooms well lit and clutter free. ? Installing safety rails on stairs. ? Using rubber mats in the bathroom and other areas that are often wet or slippery. Get help right away if: You fall or injure yourself. This information is not intended to replace advice given to you by your health care provider. Make sure you discuss any questions you have with your health care provider. Document Released: 04/11/2005 Document Revised: 12/05/2015 Document Reviewed: 12/10/2013 Elsevier Interactive Patient Education  2018 [employer] Inc[employer]  

## 2018-04-08 NOTE — Progress Notes (Signed)
Subjective:    Patient ID: Ashley Nelson, female    DOB: 1933/05/19, 84 y.o.   MRN: [id number]  Chief Complaint  Patient presents with  . Medical Management of Chronic Issues   Pt presents to the office today for chronic follow up. Pt has rheumatoid arthritis and is followed by Rheumatologists every 6 months. PT currently taking methotrexate. Hypertension  This is a chronic problem. The current episode started more than 1 year ago. The problem has been resolved since onset. The problem is controlled. Pertinent negatives include no headaches, malaise/fatigue, peripheral edema or shortness of breath. Risk factors for coronary artery disease include dyslipidemia. The current treatment provides moderate improvement. There is no history of kidney disease, CAD/MI or heart failure. Identifiable causes of hypertension include a thyroid problem.  Gastroesophageal Reflux  She reports no belching, no coughing or no heartburn. This is a chronic problem. The current episode started more than 1 year ago. The problem occurs occasionally. Pertinent negatives include no fatigue. She has tried an antacid for the symptoms. The treatment provided moderate relief.  Thyroid Problem  Presents for follow-up visit. Patient reports no constipation, diaphoresis, diarrhea, fatigue or leg swelling. The symptoms have been stable. Her past medical history is significant for hyperlipidemia. There is no history of heart failure.  Hyperlipidemia  This is a chronic problem. The current episode started more than 1 year ago. The problem is uncontrolled. Recent lipid tests were reviewed and are high. Pertinent negatives include no shortness of breath. Current antihyperlipidemic treatment includes diet change. The current treatment provides no improvement of lipids. Risk factors for coronary artery disease include dyslipidemia, hypertension and post-menopausal.  Osteoporosis  Pt had Dexa Scan on 04/04/15.    Review of Systems    Constitutional: Negative for diaphoresis, fatigue and malaise/fatigue.  Respiratory: Negative for cough and shortness of breath.   Gastrointestinal: Negative for constipation, diarrhea and heartburn.  Neurological: Negative for headaches.  All other systems reviewed and are negative.      Objective:   Physical Exam  Constitutional: She is oriented to person, place, and time. She appears well-developed and well-nourished. No distress.  HENT:  Head: Normocephalic and atraumatic.  Right Ear: External ear normal.  Left Ear: External ear normal.  Mouth/Throat: Oropharynx is clear and moist.  Eyes: Pupils are equal, round, and reactive to light.  Neck: Normal range of motion. Neck supple. No thyromegaly present.  Cardiovascular: Normal rate, regular rhythm, normal heart sounds and intact distal pulses.  No murmur heard. Pulmonary/Chest: Effort normal and breath sounds normal. No respiratory distress. She has no wheezes.  Abdominal: Soft. Bowel sounds are normal. She exhibits no distension. There is no tenderness.  Musculoskeletal: Normal range of motion. She exhibits no edema or tenderness.  Neurological: She is alert and oriented to person, place, and time. She has normal reflexes. No cranial nerve deficit.  Skin: Skin is warm and dry.  Psychiatric: She has a normal mood and affect. Her behavior is normal. Judgment and thought content normal.  Vitals reviewed.     BP 128/73   Pulse 79   Temp (!) 97 F (36.1 C) (Oral)   Ht 4' 10"  (1.473 m)   Wt 114 lb 12.8 oz (52.1 kg)   BMI 23.99 kg/m      Assessment & Plan:  Ashley Nelson comes in today with chief complaint of Medical Management of Chronic Issues   Diagnosis and orders addressed:  1. Essential hypertension - CMP14+EGFR - CBC with Differential/Platelet  2.  Gastroesophageal reflux disease, esophagitis presence not specified - CMP14+EGFR - CBC with Differential/Platelet  3. Hypothyroidism, unspecified type -  CMP14+EGFR - CBC with Differential/Platelet - TSH  4. Rheumatoid arthritis involving multiple sites with positive rheumatoid factor (HCC) - CMP14+EGFR - CBC with Differential/Platelet  5. Vitamin D deficiency - CMP14+EGFR - CBC with Differential/Platelet - VITAMIN D 25 Hydroxy (Vit-D Deficiency, Fractures)  6. Hyperlipidemia, unspecified hyperlipidemia type - CMP14+EGFR - CBC with Differential/Platelet  7. Osteoporosis, unspecified osteoporosis type, unspecified pathological fracture presence - CMP14+EGFR - CBC with Differential/Platelet - DG [facility] DEXA   Labs pending Health Maintenance reviewed Diet and exercise encouraged  Follow up plan: 6 months   [name], FNP

## 2018-04-09 ENCOUNTER — Encounter: Payer: Self-pay | Admitting: *Deleted

## 2018-04-09 ENCOUNTER — Other Ambulatory Visit: Payer: Self-pay | Admitting: Family

## 2018-04-09 LAB — TSH: TSH: 0.006 u[IU]/mL — ABNORMAL LOW (ref 0.450–4.500)

## 2018-04-09 LAB — CMP14+EGFR
ALBUMIN: 4.2 g/dL (ref 3.5–4.7)
ALK PHOS: 67 IU/L (ref 39–117)
ALT: 9 IU/L (ref 0–32)
AST: 18 IU/L (ref 0–40)
Albumin/Globulin Ratio: 1.3 (ref 1.2–2.2)
BILIRUBIN TOTAL: 0.5 mg/dL (ref 0.0–1.2)
BUN / CREAT RATIO: 14 (ref 12–28)
BUN: 12 mg/dL (ref 8–27)
CHLORIDE: 96 mmol/L (ref 96–106)
CO2: 25 mmol/L (ref 20–29)
CREATININE: 0.83 mg/dL (ref 0.57–1.00)
Calcium: 9.3 mg/dL (ref 8.7–10.3)
GFR calc Af Amer: 75 mL/min/{1.73_m2} (ref >59)
GFR calc non Af Amer: 65 mL/min/{1.73_m2} (ref >59)
GLUCOSE: 88 mg/dL (ref 65–99)
Globulin, Total: 3.2 g/dL (ref 1.5–4.5)
Potassium: 3.4 mmol/L — ABNORMAL LOW (ref 3.5–5.2)
SODIUM: 140 mmol/L (ref 134–144)
Total Protein: 7.4 g/dL (ref 6.0–8.5)

## 2018-04-09 LAB — CBC WITH DIFFERENTIAL/PLATELET
BASOS: 1 %
Basophils Absolute: 0 10*3/uL (ref 0.0–0.2)
EOS (ABSOLUTE): 0.1 10*3/uL (ref 0.0–0.4)
EOS: 3 %
HEMATOCRIT: 35.8 % (ref 34.0–46.6)
Hemoglobin: 11.8 g/dL (ref 11.1–15.9)
IMMATURE GRANS (ABS): 0 10*3/uL (ref 0.0–0.1)
IMMATURE GRANULOCYTES: 0 %
LYMPHS: 41 %
Lymphocytes Absolute: 2.3 10*3/uL (ref 0.7–3.1)
MCH: 30.3 pg (ref 26.6–33.0)
MCHC: 33 g/dL (ref 31.5–35.7)
MCV: 92 fL (ref 79–97)
Monocytes Absolute: 0.6 10*3/uL (ref 0.1–0.9)
Monocytes: 10 %
NEUTROS PCT: 45 %
Neutrophils Absolute: 2.6 10*3/uL (ref 1.4–7.0)
Platelets: 263 10*3/uL (ref 150–450)
RBC: 3.9 x10E6/uL (ref 3.77–5.28)
RDW: 12.4 % (ref 12.3–15.4)
WBC: 5.7 10*3/uL (ref 3.4–10.8)

## 2018-04-09 LAB — VITAMIN D 25 HYDROXY (VIT D DEFICIENCY, FRACTURES): VIT D 25 HYDROXY: 56.3 ng/mL (ref 30.0–100.0)

## 2018-04-09 MED ORDER — LEVOTHYROXINE SODIUM 50 MCG PO TABS: 50.0000 ug | ORAL_TABLET | Freq: Every day | ORAL | 3 refills | Status: DC

## 2018-05-01 ENCOUNTER — Ambulatory Visit (INDEPENDENT_AMBULATORY_CARE_PROVIDER_SITE_OTHER): Payer: Medicare Other | Admitting: *Deleted

## 2018-05-01 ENCOUNTER — Encounter: Payer: Self-pay | Admitting: *Deleted

## 2018-05-01 VITALS — BP 140/75 | HR 87 | Ht <= 58 in | Wt 119.0 lb

## 2018-05-01 DIAGNOSIS — Z0000 Encounter for general adult medical examination without abnormal findings: Secondary | ICD-10-CM

## 2018-05-01 NOTE — Patient Instructions (Addendum)
Please work on your goal of doing your core strengthening exercises 3 times per week.   Keep up the great work on your healthy diet and exercise regimen.   Please follow up with [name], FNP as scheduled.  Thank you for coming in for your Annual Wellness Visit today!!   Preventive Care 65 Years and Older, Female Preventive care refers to lifestyle choices and visits with your health care provider that can promote health and wellness. What does preventive care include?  A yearly physical exam. This is also called an annual well check.  Dental exams once or twice a year.  Routine eye exams. Ask your health care provider how often you should have your eyes checked.  Personal lifestyle choices, including: ? Daily care of your teeth and gums. ? Regular physical activity. ? Eating a healthy diet. ? Avoiding tobacco and drug use. ? Limiting alcohol use. ? Practicing safe sex. ? Taking low-dose aspirin every day. ? Taking vitamin and mineral supplements as recommended by your health care provider. What happens during an annual well check? The services and screenings done by your health care provider during your annual well check will depend on your age, overall health, lifestyle risk factors, and family history of disease. Counseling Your health care provider may ask you questions about your:  Alcohol use.  Tobacco use.  Drug use.  Emotional well-being.  Home and relationship well-being.  Sexual activity.  Eating habits.  History of falls.  Memory and ability to understand (cognition).  Work and work Statistician.  Reproductive health.  Screening You may have the following tests or measurements:  Height, weight, and BMI.  Blood pressure.  Lipid and cholesterol levels. These may be checked every 5 years, or more frequently if you are over 39 years old.  Skin check.  Lung cancer screening. You may have this screening every year starting at age 48 if you  have a 30-pack-year history of smoking and currently smoke or have quit within the past 15 years.  Fecal occult blood test (FOBT) of the stool. You may have this test every year starting at age 45.  Flexible sigmoidoscopy or colonoscopy. You may have a sigmoidoscopy every 5 years or a colonoscopy every 10 years starting at age 43.  Hepatitis C blood test.  Hepatitis B blood test.  Sexually transmitted disease (STD) testing.  Diabetes screening. This is done by checking your blood sugar (glucose) after you have not eaten for a while (fasting). You may have this done every 1-3 years.  Bone density scan. This is done to screen for osteoporosis. You may have this done starting at age 37.  Mammogram. This may be done every 1-2 years. Talk to your health care provider about how often you should have regular mammograms.  Talk with your health care provider about your test results, treatment options, and if necessary, the need for more tests. Vaccines Your health care provider may recommend certain vaccines, such as:  Influenza vaccine. This is recommended every year.  Tetanus, diphtheria, and acellular pertussis (Tdap, Td) vaccine. You may need a Td booster every 10 years.  Varicella vaccine. You may need this if you have not been vaccinated.  Zoster vaccine. You may need this after age 15.  Measles, mumps, and rubella (MMR) vaccine. You may need at least one dose of MMR if you were born in 1957 or later. You may also need a second dose.  Pneumococcal 13-valent conjugate (PCV13) vaccine. One dose is recommended after  age 43.  Pneumococcal polysaccharide (PPSV23) vaccine. One dose is recommended after age 35.  Meningococcal vaccine. You may need this if you have certain conditions.  Hepatitis A vaccine. You may need this if you have certain conditions or if you travel or work in places where you may be exposed to hepatitis A.  Hepatitis B vaccine. You may need this if you have certain  conditions or if you travel or work in places where you may be exposed to hepatitis B.  Haemophilus influenzae type b (Hib) vaccine. You may need this if you have certain conditions.  Talk to your health care provider about which screenings and vaccines you need and how often you need them. This information is not intended to replace advice given to you by your health care provider. Make sure you discuss any questions you have with your health care provider. Document Released: 07/29/2015 Document Revised: 03/21/2016 Document Reviewed: 05/03/2015 Elsevier Interactive Patient Education  2018 [place] in the Home Falls can cause injuries. They can happen to people of all ages. There are many things you can do to make your home safe and to help prevent falls. What can I do on the outside of my home?  Regularly fix the edges of walkways and driveways and fix any cracks.  Remove anything that might make you trip as you walk through a door, such as a raised step or threshold.  Trim any bushes or trees on the path to your home.  Use bright outdoor lighting.  Clear any walking paths of anything that might make someone trip, such as rocks or tools.  Regularly check to see if handrails are loose or broken. Make sure that both sides of any steps have handrails.  Any raised decks and porches should have guardrails on the edges.  Have any leaves, snow, or ice cleared regularly.  Use sand or salt on walking paths during winter.  Clean up any spills in your garage right away. This includes oil or grease spills. What can I do in the bathroom?  Use night lights.  Install grab bars by the toilet and in the tub and shower. Do not use towel bars as grab bars.  Use non-skid mats or decals in the tub or shower.  If you need to sit down in the shower, use a plastic, non-slip stool.  Keep the floor dry. Clean up any water that spills on the floor as soon as it  happens.  Remove soap buildup in the tub or shower regularly.  Attach bath mats securely with double-sided non-slip rug tape.  Do not have throw rugs and other things on the floor that can make you trip. What can I do in the bedroom?  Use night lights.  Make sure that you have a light by your bed that is easy to reach.  Do not use any sheets or blankets that are too big for your bed. They should not hang down onto the floor.  Have a firm chair that has side arms. You can use this for support while you get dressed.  Do not have throw rugs and other things on the floor that can make you trip. What can I do in the kitchen?  Clean up any spills right away.  Avoid walking on wet floors.  Keep items that you use a lot in easy-to-reach places.  If you need to reach something above you, use a strong step stool that has a grab bar.  Keep electrical cords out of the way.  Do not use floor polish or wax that makes floors slippery. If you must use wax, use non-skid floor wax.  Do not have throw rugs and other things on the floor that can make you trip. What can I do with my stairs?  Do not leave any items on the stairs.  Make sure that there are handrails on both sides of the stairs and use them. Fix handrails that are broken or loose. Make sure that handrails are as long as the stairways.  Check any carpeting to make sure that it is firmly attached to the stairs. Fix any carpet that is loose or worn.  Avoid having throw rugs at the top or bottom of the stairs. If you do have throw rugs, attach them to the floor with carpet tape.  Make sure that you have a light switch at the top of the stairs and the bottom of the stairs. If you do not have them, ask someone to add them for you. What else can I do to help prevent falls?  Wear shoes that: ? Do not have high heels. ? Have rubber bottoms. ? Are comfortable and fit you well. ? Are closed at the toe. Do not wear sandals.  If you  use a stepladder: ? Make sure that it is fully opened. Do not climb a closed stepladder. ? Make sure that both sides of the stepladder are locked into place. ? Ask someone to hold it for you, if possible.  Clearly mark and make sure that you can see: ? Any grab bars or handrails. ? First and last steps. ? Where the edge of each step is.  Use tools that help you move around (mobility aids) if they are needed. These include: ? Canes. ? Walkers. ? Scooters. ? Crutches.  Turn on the lights when you go into a dark area. Replace any light bulbs as soon as they burn out.  Set up your furniture so you have a clear path. Avoid moving your furniture around.  If any of your floors are uneven, fix them.  If there are any pets around you, be aware of where they are.  Review your medicines with your doctor. Some medicines can make you feel dizzy. This can increase your chance of falling. Ask your doctor what other things that you can do to help prevent falls. This information is not intended to replace advice given to you by your health care provider. Make sure you discuss any questions you have with your health care provider. Document Released: 04/28/2009 Document Revised: 12/08/2015 Document Reviewed: 08/06/2014 Elsevier Interactive Patient Education  [employer].

## 2018-05-01 NOTE — Progress Notes (Signed)
Subjective:   Ashley Nelson is a 84 y.o. female who presents for an Initial Medicare Annual Wellness Visit.  Ashley Nelson is retired from [employer].  She enjoys going to church, sewing, swimming, and walking.  She is a widow, and lives alone.  Ashley Nelson has 2 children, 10 grandchildren, 8 great grandchildren, and 1 great great grandchild.  She feels her health is unchanged from last year, and reports no hospitalizations, ER visits, or surgeries in the past year.   Review of Systems    All negative today        Objective:    Today's Vitals   05/01/18 1517  BP: 140/75  Pulse: 87  Weight: 119 lb (54 kg)  Height: 4\' 10"  (1.473 m)  PainSc: 0-No pain   Body mass index is 24.87 kg/m.  Advanced Directives 05/01/2018 10/03/2017 03/17/2015  Does Patient Have a Medical Advance Directive? No No No  Would patient like information on creating a medical advance directive? No - Patient declined No - Patient declined Yes - Educational materials given    Current Medications (verified) Outpatient Encounter Medications as of 05/01/2018  Medication Sig  . folic acid (FOLVITE) 1 MG tablet Take 2 mg by mouth daily.   . hydrochlorothiazide (HYDRODIURIL) 12.5 MG tablet TAKE 1 TABLET BY MOUTH ONCE DAILY  . levothyroxine (SYNTHROID, LEVOTHROID) 50 MCG tablet Take 1 tablet (50 mcg total) by mouth daily.  . methotrexate (RHEUMATREX) 2.5 MG tablet Take 2.5 mg by mouth once a week. 6 tablets once a week   No facility-administered encounter medications on file as of 05/01/2018.     Allergies (verified) Patient has no known allergies.   History: Past Medical History:  Diagnosis Date  . Cancer ([place])    cervical?  . Hypertension   . Osteoporosis   . RA (rheumatoid arthritis) ([place]) 2015  . Thyroid disease    Past Surgical History:  Procedure Laterality Date  . ABDOMINAL HYSTERECTOMY    . Cataract surgery    . Etopic pregnancy     No family history on file. Social History   Socioeconomic  History  . Marital status: Widowed    Spouse name: Not on file  . Number of children: Not on file  . Years of education: Not on file  . Highest education level: Not on file  Occupational History  . Not on file  Social Needs  . Financial resource strain: Not on file  . Food insecurity:    Worry: Not on file    Inability: Not on file  . Transportation needs:    Medical: Not on file    Non-medical: Not on file  Tobacco Use  . Smoking status: Never Smoker  . Smokeless tobacco: Never Used  Substance and Sexual Activity  . Alcohol use: No  . Drug use: No  . Sexual activity: Not on file  Lifestyle  . Physical activity:    Days per week: Not on file    Minutes per session: Not on file  . Stress: Not on file  Relationships  . Social connections:    Talks on phone: Not on file    Gets together: Not on file    Attends religious service: Not on file    Active member of club or organization: Not on file    Attends meetings of clubs or organizations: Not on file    Relationship status: Not on file  Other Topics Concern  . Not on file  Social History Narrative  .  Not on file    Tobacco Counseling Counseling given: No   Clinical Intake:     Pain Score: 0-No pain                  Activities of Daily Living In your present state of health, do you have any difficulty performing the following activities: 05/01/2018  Hearing? N  Vision? N  Difficulty concentrating or making decisions? N  Walking or climbing stairs? N  Dressing or bathing? N  Doing errands, shopping? N  Preparing Food and eating ? N  Using the Toilet? N  In the past six months, have you accidently leaked urine? Y  Comment occassional leaking  Do you have problems with loss of bowel control? N  Managing your Medications? N  Managing your Finances? N  Housekeeping or managing your Housekeeping? N  Some recent data might be hidden     Immunizations and Health Maintenance  There is no  immunization history on file for this patient. Health Maintenance Due  Topic Date Due  . TETANUS/TDAP  12/11/1951   Patient declined tdap today  Patient Care Team: Sharion Balloon, FNP as PCP - General (Nurse Practitioner)       Assessment:   This is a routine wellness examination for [name].  Hearing/Vision screen No hearing deficit noted. Patient states she goes yearly to an eye doctor in [place], [place]- she does not remember the name of the office.  She has had bilateral cataracts removed, and sees well.  States she uses over the counter reading glasses for reading.  Dietary issues and exercise activities discussed:  [name] usually eats 2 meals per day.  She avoids sugary and processed foods.  She states she eats mostly lean proteins, vegetables, fruits, and whole grains.   Current Exercise Habits: Home exercise routine, Type of exercise: walking, Time (Minutes): 45, Frequency (Times/Week): 3, Weekly Exercise (Minutes/Week): 135, Intensity: Moderate, Exercise limited by: orthopedic condition(s)  Goals    . Core strengthening exercises      Do core exercises 3 times per week.       Depression Screen PHQ 2/9 Scores 05/01/2018 04/08/2018 10/24/2017 10/03/2017 04/04/2017 02/19/2017 09/20/2016  PHQ - 2 Score 0 0 0 1 0 0 0  PHQ- 9 Score - - - - - - -    Fall Risk Fall Risk  05/01/2018 04/08/2018 10/24/2017 10/03/2017 04/04/2017  Falls in the past year? Yes No No No No  Comment stepped in a hole while gardening - - - -  Number falls in past yr: 1 - - - -  Injury with Fall? No - - - -  Follow up Education provided;Falls prevention discussed - - - -    Is the patient's home free of loose throw rugs in walkways, pet beds, electrical cords, etc?   yes      Grab bars in the bathroom? no      Handrails on the stairs?   yes      Adequate lighting?   yes   Cognitive Function: MMSE - Mini Mental State Exam 05/01/2018  Orientation to time 5  Orientation to Place 4  Registration 3    Attention/ Calculation 5  Recall 3  Language- name 2 objects 2  Language- repeat 1  Language- follow 3 step command 3  Language- read & follow direction 1  Write a sentence 1  Copy design 1  Total score 29        Screening Tests Health Maintenance  Topic Date Due  . TETANUS/TDAP  12/11/1951  . INFLUENZA VACCINE  12/03/2018 (Originally 02/13/2018)  . PNA vac Low Risk Adult (1 of 2 - PCV13) 01/24/2019 (Originally 12/12/1999)  . DEXA SCAN  04/09/2019   Patient declined tdap, flu, and pneumonia vaccines today   Qualifies for Shingles Vaccine? Declined today  Cancer Screenings: Lung: Low Dose CT Chest recommended if Age 52-80 years, 30 pack-year currently smoking OR have quit w/in 15years. Patient does not qualify. Breast: Up to date on Mammogram? No, patient will contact office if she decides to have mammogram. Up to date of Bone Density/Dexa? Yes Colorectal: Patient is thinking about doing the Cologuard test.  She states she will let office know when she wants test sent to her.   Additional Screenings:  Hepatitis C Screening:  Not indicated     Plan:     Work on your goal of doing your core strengthening exercises 3 times per week.  Keep up the great work on your healthy diet and exercise regimen.  Follow up with [name], FNP as scheduled.  I have personally reviewed and noted the following in the patient's chart:   . Medical and social history . Use of alcohol, tobacco or illicit drugs  . Current medications and supplements . Functional ability and status . Nutritional status . Physical activity . Advanced directives . List of other physicians . Hospitalizations, surgeries, and ER visits in previous 12 months . Vitals . Screenings to include cognitive, depression, and falls . Referrals and appointments  In addition, I have reviewed and discussed with patient certain preventive protocols, quality metrics, and best practice recommendations. A written  personalized care plan for preventive services as well as general preventive health recommendations were provided to patient.     WYATT, AMY M, RN   05/01/2018    I have reviewed and agree with the above AWV documentation.   [name], FNP

## 2018-06-05 DIAGNOSIS — M0589 Other rheumatoid arthritis with rheumatoid factor of multiple sites: Secondary | ICD-10-CM | POA: Diagnosis not present

## 2018-06-05 DIAGNOSIS — M5030 Other cervical disc degeneration, unspecified cervical region: Secondary | ICD-10-CM | POA: Diagnosis not present

## 2018-06-05 DIAGNOSIS — Z79899 Other long term (current) drug therapy: Secondary | ICD-10-CM | POA: Diagnosis not present

## 2018-07-01 ENCOUNTER — Other Ambulatory Visit: Payer: Self-pay | Admitting: Family

## 2018-07-01 DIAGNOSIS — I10 Essential (primary) hypertension: Secondary | ICD-10-CM

## 2018-07-02 ENCOUNTER — Ambulatory Visit (INDEPENDENT_AMBULATORY_CARE_PROVIDER_SITE_OTHER): Payer: Medicare Other | Admitting: Family Medicine

## 2018-07-02 ENCOUNTER — Encounter: Payer: Self-pay | Admitting: Family Medicine

## 2018-07-02 VITALS — BP 147/78 | HR 90 | Temp 97.5°F | Ht <= 58 in | Wt 124.0 lb

## 2018-07-02 DIAGNOSIS — M5431 Sciatica, right side: Secondary | ICD-10-CM | POA: Diagnosis not present

## 2018-07-02 MED ORDER — DICLOFENAC SODIUM 75 MG PO TBEC: 75.0000 mg | DELAYED_RELEASE_TABLET | Freq: Two times a day (BID) | ORAL | 2 refills | Status: DC

## 2018-07-02 MED ORDER — BETAMETHASONE SOD PHOS & ACET 6 (3-3) MG/ML IJ SUSP: 6.0000 mg | Freq: Once | INTRAMUSCULAR | Status: DC

## 2018-07-02 NOTE — Progress Notes (Signed)
Subjective:  Patient ID: Ashley Nelson, female    DOB: January 09, 1933  Age: 84 y.o. MRN: [id number]  CC: Back Pain and radiating down right leg   HPI Ashley Nelson presents for pain at the lumbar midline buttock and into the right thigh both anteriorly and laterally.  Extremity is much worse.  In fact she can only walk a few steps before stopping due to the discomfort.  Onset was 3 days ago.  There is no known injury.  The pain is described as sharp.  It is moderately severe.  There is relief when she rests.  Depression screen [facility] 2/9 07/02/2018 05/01/2018 04/08/2018  Decreased Interest 1 0 0  Down, Depressed, Hopeless 1 0 0  PHQ - 2 Score 2 0 0  Altered sleeping 0 - -  Tired, decreased energy 0 - -  Change in appetite 0 - -  Feeling bad or failure about yourself  0 - -  Trouble concentrating 0 - -  Moving slowly or fidgety/restless 0 - -  Suicidal thoughts 0 - -  PHQ-9 Score 2 - -    History Shweta has a past medical history of Cancer ([place]), Hypertension, Osteoporosis, RA (rheumatoid arthritis) ([place]) (2015), and Thyroid disease.   She has a past surgical history that includes Etopic pregnancy; Abdominal hysterectomy; and Cataract surgery.   Her family history is not on file.She reports that she has never smoked. She has never used smokeless tobacco. She reports that she does not drink alcohol or use drugs.    ROS Review of Systems  Objective:  BP (!) 147/78   Pulse 90   Temp (!) 97.5 F (36.4 C) (Oral)   Ht 4\' 10"  (1.473 m)   Wt 124 lb (56.2 kg)   BMI 25.92 kg/m   BP Readings from Last 3 Encounters:  07/02/18 (!) 147/78  05/01/18 140/75  04/08/18 128/73    Wt Readings from Last 3 Encounters:  07/02/18 124 lb (56.2 kg)  05/01/18 119 lb (54 kg)  04/08/18 114 lb 12.8 oz (52.1 kg)     Physical Exam Constitutional:      General: She is not in acute distress.    Appearance: She is well-developed.  Pulmonary:     Breath sounds: Normal breath sounds.    Musculoskeletal:        General: Tenderness (At L3-S1 midline.  Tender at right sciatic notch.  Tender range of motion for hip flexion and rotation on the right) present.  Skin:    General: Skin is warm and dry.  Neurological:     Mental Status: She is alert and oriented to person, place, and time.       Assessment & Plan:   Mardelle was seen today for back pain and radiating down right leg.  Diagnoses and all orders for this visit:  Sciatica of right side -     betamethasone acetate-betamethasone sodium phosphate (CELESTONE) injection 6 mg  Other orders -     diclofenac (VOLTAREN) 75 MG EC tablet; Take 1 tablet (75 mg total) by mouth 2 (two) times daily. For muscle and  Joint pain       I have discontinued Emyah Floyd's methotrexate and folic acid. I am also having her start on diclofenac. Additionally, I am having her maintain her levothyroxine and hydrochlorothiazide. We will continue to administer betamethasone acetate-betamethasone sodium phosphate.  Allergies as of 07/02/2018   No Known Allergies     Medication List       Accurate  as of July 02, 2018  2:34 PM. Always use your most recent med list.        diclofenac 75 MG EC tablet Commonly known as:  VOLTAREN Take 1 tablet (75 mg total) by mouth 2 (two) times daily. For muscle and  Joint pain   hydrochlorothiazide 12.5 MG tablet Commonly known as:  HYDRODIURIL TAKE 1 TABLET BY MOUTH ONCE DAILY   levothyroxine 50 MCG tablet Commonly known as:  SYNTHROID, LEVOTHROID Take 1 tablet (50 mcg total) by mouth daily.        Follow-up: Return if symptoms worsen or fail to improve.  Claretta Fraise, M.D.

## 2018-10-07 ENCOUNTER — Ambulatory Visit: Payer: Medicare Other | Admitting: Family

## 2018-10-23 ENCOUNTER — Other Ambulatory Visit: Payer: Self-pay

## 2018-10-23 ENCOUNTER — Encounter: Payer: Self-pay | Admitting: Family

## 2018-10-23 ENCOUNTER — Ambulatory Visit (INDEPENDENT_AMBULATORY_CARE_PROVIDER_SITE_OTHER): Payer: Medicare Other | Admitting: Family

## 2018-10-23 VITALS — BP 156/100 | HR 87 | Temp 97.2°F | Ht <= 58 in | Wt 128.0 lb

## 2018-10-23 DIAGNOSIS — E785 Hyperlipidemia, unspecified: Secondary | ICD-10-CM

## 2018-10-23 DIAGNOSIS — M0579 Rheumatoid arthritis with rheumatoid factor of multiple sites without organ or systems involvement: Secondary | ICD-10-CM

## 2018-10-23 DIAGNOSIS — I10 Essential (primary) hypertension: Secondary | ICD-10-CM

## 2018-10-23 DIAGNOSIS — K219 Gastro-esophageal reflux disease without esophagitis: Secondary | ICD-10-CM | POA: Diagnosis not present

## 2018-10-23 DIAGNOSIS — E039 Hypothyroidism, unspecified: Secondary | ICD-10-CM

## 2018-10-23 MED ORDER — LEVOTHYROXINE SODIUM 50 MCG PO TABS: 50.0000 ug | ORAL_TABLET | Freq: Every day | ORAL | 3 refills | Status: DC

## 2018-10-23 MED ORDER — HYDROCHLOROTHIAZIDE 12.5 MG PO TABS: 12.5000 mg | ORAL_TABLET | Freq: Every day | ORAL | 2 refills | Status: DC

## 2018-10-23 NOTE — Patient Instructions (Signed)
Rheumatoid Arthritis Rheumatoid arthritis (RA) is a long-term (chronic) disease that causes inflammation in your joints. RA may start slowly. It usually affects the small joints of the hands and feet. Usually, the same joints are affected on both sides of your body. Inflammation from RA can also affect other parts of your body, including your heart, eyes, or lungs. RA is an autoimmune disease. That means that your body's defense system (immune system) mistakenly attacks healthy body tissues. There is no cure for RA, but medicines can help your symptoms and halt or slow down the progression of the disease. What are the causes? The exact cause of RA is not known. What increases the risk? This condition is more likely to develop in:  Women.  People who have a family history of RA or other autoimmune diseases. What are the signs or symptoms? Symptoms of this condition vary from person to person. Symptoms usually start gradually. They are often worse in the morning. The first symptom may be morning stiffness that lasts longer than 30 minutes. As RA progresses, symptoms may include:  Pain, stiffness, swelling, warmth, and tenderness in joints on both sides of your body.  Loss of energy.  Loss of appetite.  Weight loss.  Low-grade fever.  Dry eyes and dry mouth.  Firm lumps (rheumatoid nodules) that grow beneath your skin in areas such as your forearm bones near your elbows and on your hands.  Changes in the appearance of joints (deformity) and loss of joint function. Symptoms of RA often come and go. Sometimes, symptoms get worse for a period of time. These are called flares. How is this diagnosed? This condition is diagnosed based on your symptoms, medical history, and physical exam. You may have X-rays or MRI to check for the type of joint changes that are caused by RA. You may also have blood tests to look for:  Proteins (antibodies) that your immune system may make if you have RA.  They include rheumatoid factor (RF) and anti-CCP. ? When blood tests show these proteins, you are said to have "seropositive RA." ? When blood tests do not show these proteins, you may have "seronegative RA."  Inflammation in your blood.  A low number of red blood cells (anemia). How is this treated? The goals of treatment are to relieve pain, reduce inflammation, and slow down or stop joint damage and disability. Treatment may include:  Lifestyle changes. It is important to rest, eat a healthy diet, and exercise.  Medicines. Your health care provider may adjust your medicines every 3 months until treatment goals are reached. Common medicines include: ? Pain relievers (analgesics). ? Corticosteroids and NSAIDs to reduce inflammation. ? Disease-modifying antirheumatic drugs (DMARDs) to try to slow the course of the disease. ? Biologic response modifiers to reduce inflammation and damage.  Physical therapy and occupational therapy.  Surgery, if you have severe joint damage. Joint replacement or fusing of joints may be needed. Your health care provider will work with you to identify the best treatment option for you based on assessment of the overall disease activity in your body. Follow these instructions at home:  Take over-the-counter and prescription medicines only as told by your health care provider.  Start an exercise program as told by your health care provider.  Rest when you are having a flare.  Return to your normal activities as told by your health care provider. Ask your health care provider what activities are safe for you.  Keep all follow-up visits as told  by your health care provider. This is important. Where to find more information  [employer]: www.rheumatology.[place]: www.arthritis.org Contact a health care provider if:  You have a flare-up of RA symptoms.  You have a fever.  You have side effects from your  medicines. Get help right away if:  You have chest pain.  You have trouble breathing.  You quickly develop a hot, painful joint that is more severe than your usual joint aches. This information is not intended to replace advice given to you by your health care provider. Make sure you discuss any questions you have with your health care provider. Document Released: 06/29/2000 Document Revised: 12/13/2016 Document Reviewed: 04/14/2015 Elsevier Interactive Patient Education  2019 [employer].

## 2018-10-23 NOTE — Progress Notes (Signed)
Subjective:    Patient ID: Ashley Nelson, female    DOB: 24-Apr-1933, 85 y.o.   MRN: [id number]  Pt presents to the office today for chronic follow up. Pt has rheumatoid arthritis and is followed by Rheumatologists every76month. PT currently taking methotrexate. Hypertension  This is a chronic problem. The current episode started more than 1 year ago. The problem has been waxing and waning since onset. The problem is uncontrolled. Pertinent negatives include no headaches, malaise/fatigue, peripheral edema or shortness of breath. Risk factors for coronary artery disease include dyslipidemia and sedentary lifestyle. The current treatment provides moderate improvement. There is no history of kidney disease, CAD/MI or heart failure. Identifiable causes of hypertension include a thyroid problem.  Gastroesophageal Reflux  She reports no belching, no heartburn or no hoarse voice. This is a chronic problem. The current episode started more than 1 year ago. The problem occurs rarely. Pertinent negatives include no fatigue. She has tried a diet change for the symptoms. The treatment provided mild relief.  Thyroid Problem  Presents for follow-up visit. Symptoms include hair loss. Patient reports no constipation, depressed mood, fatigue or hoarse voice. The symptoms have been stable. Her past medical history is significant for hyperlipidemia. There is no history of heart failure.  Arthritis  Presents for follow-up visit. She complains of pain and stiffness. The symptoms have been stable. Affected locations include the right MCP, left MCP, right knee and left knee. Pertinent negatives include no fatigue.  Hyperlipidemia  This is a chronic problem. The current episode started more than 1 year ago. The problem is uncontrolled. Recent lipid tests were reviewed and are high. Pertinent negatives include no shortness of breath. Current antihyperlipidemic treatment includes diet change. The current treatment provides  no improvement of lipids.      Review of Systems  Constitutional: Negative for fatigue and malaise/fatigue.  HENT: Negative for hoarse voice.   Respiratory: Negative for shortness of breath.   Gastrointestinal: Negative for constipation and heartburn.  Musculoskeletal: Positive for arthritis and stiffness.  Neurological: Negative for headaches.  All other systems reviewed and are negative.      Objective:   Physical Exam Vitals signs reviewed.  Constitutional:      General: She is not in acute distress.    Appearance: She is well-developed.  HENT:     Head: Normocephalic and atraumatic.     Right Ear: Tympanic membrane normal.     Left Ear: Tympanic membrane normal.  Eyes:     Pupils: Pupils are equal, round, and reactive to light.  Neck:     Musculoskeletal: Normal range of motion and neck supple.     Thyroid: No thyromegaly.  Cardiovascular:     Rate and Rhythm: Normal rate and regular rhythm.     Heart sounds: Normal heart sounds. No murmur.  Pulmonary:     Effort: Pulmonary effort is normal. No respiratory distress.     Breath sounds: Normal breath sounds. No wheezing.  Abdominal:     General: Bowel sounds are normal. There is no distension.     Palpations: Abdomen is soft.     Tenderness: There is no abdominal tenderness.  Musculoskeletal: Normal range of motion.        General: No tenderness.  Skin:    General: Skin is warm and dry.  Neurological:     Mental Status: She is alert and oriented to person, place, and time.     Cranial Nerves: No cranial nerve deficit.  Deep Tendon Reflexes: Reflexes are normal and symmetric.  Psychiatric:        Behavior: Behavior normal.        Thought Content: Thought content normal.        Judgment: Judgment normal.     BP (!) 156/100   Pulse 87   Temp (!) 97.2 F (36.2 C) (Oral)   Ht 4' 10"  (1.473 m)   Wt 128 lb (58.1 kg)   BMI 26.75 kg/m      Assessment & Plan:  Ashley Nelson comes in today with chief  complaint of Medical Management of Chronic Issues   Diagnosis and orders addressed:  1. Essential hypertension -Will not change since 1st BP was 149/83, she states she did not take her BP yesterday because she forgot. But did take it this morning.  - CMP14+EGFR - CBC with Differential/Platelet - hydrochlorothiazide (HYDRODIURIL) 12.5 MG tablet; Take 1 tablet (12.5 mg total) by mouth daily.  Dispense: 90 tablet; Refill: 2  2. Gastroesophageal reflux disease, esophagitis presence not specified - CMP14+EGFR - CBC with Differential/Platelet  3. Hyperlipidemia, unspecified hyperlipidemia type - CMP14+EGFR - CBC with Differential/Platelet  4. Hypothyroidism, unspecified type - CMP14+EGFR - CBC with Differential/Platelet - TSH - levothyroxine (SYNTHROID, LEVOTHROID) 50 MCG tablet; Take 1 tablet (50 mcg total) by mouth daily.  Dispense: 90 tablet; Refill: 3  5. Rheumatoid arthritis involving multiple sites with positive rheumatoid factor ([place]) - CMP14+EGFR - CBC with Differential/Platelet   Labs pending Health Maintenance reviewed Diet and exercise encouraged  Follow up plan: 6 months    [name], FNP

## 2018-10-24 LAB — CMP14+EGFR
ALT: 11 IU/L (ref 0–32)
AST: 16 IU/L (ref 0–40)
Albumin/Globulin Ratio: 1.4 (ref 1.2–2.2)
Albumin: 4.4 g/dL (ref 3.6–4.6)
Alkaline Phosphatase: 66 IU/L (ref 39–117)
BUN/Creatinine Ratio: 23 (ref 12–28)
BUN: 20 mg/dL (ref 8–27)
Bilirubin Total: 0.4 mg/dL (ref 0.0–1.2)
CO2: 27 mmol/L (ref 20–29)
Calcium: 9.1 mg/dL (ref 8.7–10.3)
Chloride: 100 mmol/L (ref 96–106)
Creatinine, Ser: 0.87 mg/dL (ref 0.57–1.00)
GFR calc Af Amer: 71 mL/min/{1.73_m2} (ref >59)
GFR calc non Af Amer: 62 mL/min/{1.73_m2} (ref >59)
Globulin, Total: 3.1 g/dL (ref 1.5–4.5)
Glucose: 78 mg/dL (ref 65–99)
Potassium: 4 mmol/L (ref 3.5–5.2)
Sodium: 140 mmol/L (ref 134–144)
Total Protein: 7.5 g/dL (ref 6.0–8.5)

## 2018-10-24 LAB — CBC WITH DIFFERENTIAL/PLATELET
Basophils Absolute: 0 10*3/uL (ref 0.0–0.2)
Basos: 1 %
EOS (ABSOLUTE): 0.2 10*3/uL (ref 0.0–0.4)
Eos: 4 %
Hematocrit: 37.5 % (ref 34.0–46.6)
Hemoglobin: 12.5 g/dL (ref 11.1–15.9)
Immature Grans (Abs): 0 10*3/uL (ref 0.0–0.1)
Immature Granulocytes: 0 %
Lymphocytes Absolute: 2.2 10*3/uL (ref 0.7–3.1)
Lymphs: 34 %
MCH: 31.1 pg (ref 26.6–33.0)
MCHC: 33.3 g/dL (ref 31.5–35.7)
MCV: 93 fL (ref 79–97)
Monocytes Absolute: 0.7 10*3/uL (ref 0.1–0.9)
Monocytes: 11 %
Neutrophils Absolute: 3.2 10*3/uL (ref 1.4–7.0)
Neutrophils: 50 %
Platelets: 220 10*3/uL (ref 150–450)
RBC: 4.02 x10E6/uL (ref 3.77–5.28)
RDW: 13.6 % (ref 11.7–15.4)
WBC: 6.4 10*3/uL (ref 3.4–10.8)

## 2018-10-24 LAB — TSH: TSH: 1.93 u[IU]/mL (ref 0.450–4.500)

## 2018-10-30 DIAGNOSIS — M06842 Other specified rheumatoid arthritis, left hand: Secondary | ICD-10-CM | POA: Diagnosis not present

## 2018-10-30 DIAGNOSIS — M06841 Other specified rheumatoid arthritis, right hand: Secondary | ICD-10-CM | POA: Diagnosis not present

## 2018-10-30 DIAGNOSIS — M0589 Other rheumatoid arthritis with rheumatoid factor of multiple sites: Secondary | ICD-10-CM | POA: Diagnosis not present

## 2018-10-30 DIAGNOSIS — M5030 Other cervical disc degeneration, unspecified cervical region: Secondary | ICD-10-CM | POA: Diagnosis not present

## 2018-10-30 DIAGNOSIS — M79641 Pain in right hand: Secondary | ICD-10-CM | POA: Diagnosis not present

## 2018-10-30 DIAGNOSIS — M79642 Pain in left hand: Secondary | ICD-10-CM | POA: Diagnosis not present

## 2018-10-30 DIAGNOSIS — Z79899 Other long term (current) drug therapy: Secondary | ICD-10-CM | POA: Diagnosis not present

## 2018-11-04 ENCOUNTER — Telehealth: Payer: Self-pay | Admitting: Family

## 2018-11-04 ENCOUNTER — Other Ambulatory Visit: Payer: Self-pay | Admitting: *Deleted

## 2018-11-04 DIAGNOSIS — E039 Hypothyroidism, unspecified: Secondary | ICD-10-CM

## 2018-11-04 DIAGNOSIS — I10 Essential (primary) hypertension: Secondary | ICD-10-CM

## 2018-11-04 MED ORDER — HYDROCHLOROTHIAZIDE 12.5 MG PO TABS: 12.5000 mg | ORAL_TABLET | Freq: Every day | ORAL | 2 refills | Status: DC

## 2018-11-04 MED ORDER — LEVOTHYROXINE SODIUM 50 MCG PO TABS: 50.0000 ug | ORAL_TABLET | Freq: Every day | ORAL | 3 refills | Status: DC

## 2018-11-04 NOTE — Telephone Encounter (Signed)
Aware.  Scripts were sent to [facility], [facility].  Resent to [facility].

## 2018-11-04 NOTE — Telephone Encounter (Signed)
It looks to me as though Ashley Nelson did send the prescriptions on April 9 with refills, please check after me to make sure.

## 2018-11-04 NOTE — Telephone Encounter (Signed)
Back up provider.  Please review labs and advise.

## 2019-04-24 ENCOUNTER — Other Ambulatory Visit: Payer: Self-pay

## 2019-04-27 ENCOUNTER — Encounter: Payer: Self-pay | Admitting: Family

## 2019-04-27 ENCOUNTER — Ambulatory Visit (INDEPENDENT_AMBULATORY_CARE_PROVIDER_SITE_OTHER): Payer: Medicare Other | Admitting: Family

## 2019-04-27 ENCOUNTER — Other Ambulatory Visit: Payer: Self-pay

## 2019-04-27 VITALS — BP 162/78 | HR 69 | Temp 97.8°F | Ht <= 58 in | Wt 124.6 lb

## 2019-04-27 DIAGNOSIS — K219 Gastro-esophageal reflux disease without esophagitis: Secondary | ICD-10-CM

## 2019-04-27 DIAGNOSIS — E039 Hypothyroidism, unspecified: Secondary | ICD-10-CM | POA: Diagnosis not present

## 2019-04-27 DIAGNOSIS — E559 Vitamin D deficiency, unspecified: Secondary | ICD-10-CM | POA: Diagnosis not present

## 2019-04-27 DIAGNOSIS — M0579 Rheumatoid arthritis with rheumatoid factor of multiple sites without organ or systems involvement: Secondary | ICD-10-CM | POA: Diagnosis not present

## 2019-04-27 DIAGNOSIS — I10 Essential (primary) hypertension: Secondary | ICD-10-CM

## 2019-04-27 DIAGNOSIS — E785 Hyperlipidemia, unspecified: Secondary | ICD-10-CM | POA: Diagnosis not present

## 2019-04-27 NOTE — Progress Notes (Signed)
Subjective:    Patient ID: Ashley Nelson, female    DOB: Aug 12, 1932, 85 y.o.   MRN: [id number]  Chief Complaint  Patient presents with  . Medical Management of Chronic Issues   Pt presents to the office today for chronic follow up. Pt has rheumatoid arthritis and is followed by Rheumatologists every54month. PT stopped her  Methotrexate about a year ago and is doing ok.  Hypertension This is a chronic problem. The current episode started more than 1 year ago. The problem has been waxing and waning since onset. The problem is uncontrolled. Associated symptoms include malaise/fatigue. Pertinent negatives include no headaches, peripheral edema or shortness of breath. Risk factors for coronary artery disease include dyslipidemia and sedentary lifestyle. The current treatment provides mild improvement. There is no history of kidney disease, CAD/MI or heart failure. Identifiable causes of hypertension include a thyroid problem.  Gastroesophageal Reflux She complains of heartburn. She reports no belching. This is a chronic problem. The current episode started more than 1 year ago. The problem occurs occasionally. The problem has been waxing and waning. Associated symptoms include fatigue. She has tried a PPI for the symptoms. The treatment provided moderate relief.  Thyroid Problem Presents for follow-up visit. Symptoms include fatigue. Patient reports no constipation, depressed mood or diarrhea. The symptoms have been stable. Her past medical history is significant for hyperlipidemia. There is no history of heart failure.  Arthritis Presents for follow-up visit. Affected locations include the right MCP and left MCP. Associated symptoms include fatigue. Pertinent negatives include no diarrhea.  Hyperlipidemia This is a chronic problem. The current episode started more than 1 year ago. The problem is uncontrolled. Recent lipid tests were reviewed and are high. Pertinent negatives include no shortness  of breath. Current antihyperlipidemic treatment includes statins. The current treatment provides moderate improvement of lipids. Risk factors for coronary artery disease include dyslipidemia, hypertension, a sedentary lifestyle and post-menopausal.      Review of Systems  Constitutional: Positive for fatigue and malaise/fatigue.  Respiratory: Negative for shortness of breath.   Gastrointestinal: Positive for heartburn. Negative for constipation and diarrhea.  Musculoskeletal: Positive for arthritis.  Neurological: Negative for headaches.  All other systems reviewed and are negative.      Objective:   Physical Exam Vitals signs reviewed.  Constitutional:      General: She is not in acute distress.    Appearance: She is well-developed.  HENT:     Head: Normocephalic and atraumatic.     Right Ear: Tympanic membrane normal.     Left Ear: Tympanic membrane normal.  Eyes:     Pupils: Pupils are equal, round, and reactive to light.  Neck:     Musculoskeletal: Normal range of motion and neck supple.     Thyroid: No thyromegaly.  Cardiovascular:     Rate and Rhythm: Normal rate and regular rhythm.     Heart sounds: Normal heart sounds. No murmur.  Pulmonary:     Effort: Pulmonary effort is normal. No respiratory distress.     Breath sounds: Normal breath sounds. No wheezing.  Abdominal:     General: Bowel sounds are normal. There is no distension.     Palpations: Abdomen is soft.     Tenderness: There is no abdominal tenderness.  Musculoskeletal: Normal range of motion.        General: No tenderness.  Skin:    General: Skin is warm and dry.  Neurological:     Mental Status: She is alert and oriented  to person, place, and time.     Cranial Nerves: No cranial nerve deficit.     Deep Tendon Reflexes: Reflexes are normal and symmetric.  Psychiatric:        Behavior: Behavior normal.        Thought Content: Thought content normal.        Judgment: Judgment normal.       BP  (!) 162/92   Pulse 79   Temp 97.8 F (36.6 C) (Temporal)   Ht _0  (1.473 m)   Wt 124 lb 9.6 oz (56.5 kg)   SpO2 99%   BMI 26.04 kg/m      Assessment & Plan:  Monice Lundy comes in today with chief complaint of Medical Management of Chronic Issues   Diagnosis and orders addressed:  1. Essential hypertension - CMP14+EGFR - CBC with Differential/Platelet  2. Gastroesophageal reflux disease, unspecified whether esophagitis present - CMP14+EGFR - CBC with Differential/Platelet  3. Hypothyroidism, unspecified type - CMP14+EGFR - CBC with Differential/Platelet - TSH  4. Rheumatoid arthritis involving multiple sites with positive rheumatoid factor (HCC) - CMP14+EGFR - CBC with Differential/Platelet  5. Hyperlipidemia, unspecified hyperlipidemia type - CMP14+EGFR - CBC with Differential/Platelet - Lipid panel  6. Vitamin D deficiency - CMP14+EGFR - CBC with Differential/Platelet - VITAMIN D 25 Hydroxy (Vit-D Deficiency, Fractures)   Labs pending Health Maintenance reviewed Diet and exercise encouraged  Follow up plan: 6 months    [name], FNP

## 2019-04-27 NOTE — Patient Instructions (Signed)
Health Maintenance After Age 83 After age 83, you are at a higher risk for certain long-term diseases and infections as well as injuries from falls. Falls are a major cause of broken bones and head injuries in people who are older than age 83. Getting regular preventive care can help to keep you healthy and well. Preventive care includes getting regular testing and making lifestyle changes as recommended by your health care provider. Talk with your health care provider about:  Which screenings and tests you should have. A screening is a test that checks for a disease when you have no symptoms.  A diet and exercise plan that is right for you. What should I know about screenings and tests to prevent falls? Screening and testing are the best ways to find a health problem early. Early diagnosis and treatment give you the best chance of managing medical conditions that are common after age 83. Certain conditions and lifestyle choices may make you more likely to have a fall. Your health care provider may recommend:  Regular vision checks. Poor vision and conditions such as cataracts can make you more likely to have a fall. If you wear glasses, make sure to get your prescription updated if your vision changes.  Medicine review. Work with your health care provider to regularly review all of the medicines you are taking, including over-the-counter medicines. Ask your health care provider about any side effects that may make you more likely to have a fall. Tell your health care provider if any medicines that you take make you feel dizzy or sleepy.  Osteoporosis screening. Osteoporosis is a condition that causes the bones to get weaker. This can make the bones weak and cause them to break more easily.  Blood pressure screening. Blood pressure changes and medicines to control blood pressure can make you feel dizzy.  Strength and balance checks. Your health care provider may recommend certain tests to check your  strength and balance while standing, walking, or changing positions.  Foot health exam. Foot pain and numbness, as well as not wearing proper footwear, can make you more likely to have a fall.  Depression screening. You may be more likely to have a fall if you have a fear of falling, feel emotionally low, or feel unable to do activities that you used to do.  Alcohol use screening. Using too much alcohol can affect your balance and may make you more likely to have a fall. What actions can I take to lower my risk of falls? General instructions  Talk with your health care provider about your risks for falling. Tell your health care provider if: ? You fall. Be sure to tell your health care provider about all falls, even ones that seem minor. ? You feel dizzy, sleepy, or off-balance.  Take over-the-counter and prescription medicines only as told by your health care provider. These include any supplements.  Eat a healthy diet and maintain a healthy weight. A healthy diet includes low-fat dairy products, low-fat (lean) meats, and fiber from whole grains, beans, and lots of fruits and vegetables. Home safety  Remove any tripping hazards, such as rugs, cords, and clutter.  Install safety equipment such as grab bars in bathrooms and safety rails on stairs.  Keep rooms and walkways well-lit. Activity   Follow a regular exercise program to stay fit. This will help you maintain your balance. Ask your health care provider what types of exercise are appropriate for you.  If you need a cane or   walker, use it as recommended by your health care provider.  Wear supportive shoes that have nonskid soles. Lifestyle  Do not drink alcohol if your health care provider tells you not to drink.  If you drink alcohol, limit how much you have: ? 0-1 drink a day for women. ? 0-2 drinks a day for men.  Be aware of how much alcohol is in your drink. In the [place] one drink equals one typical bottle of beer (12  oz), one-half glass of wine (5 oz), or one shot of hard liquor (1 oz).  Do not use any products that contain nicotine or tobacco, such as cigarettes and e-cigarettes. If you need help quitting, ask your health care provider. Summary  Having a healthy lifestyle and getting preventive care can help to protect your health and wellness after age 83.  Screening and testing are the best way to find a health problem early and help you avoid having a fall. Early diagnosis and treatment give you the best chance for managing medical conditions that are more common for people who are older than age 83.  Falls are a major cause of broken bones and head injuries in people who are older than age 83. Take precautions to prevent a fall at home.  Work with your health care provider to learn what changes you can make to improve your health and wellness and to prevent falls. This information is not intended to replace advice given to you by your health care provider. Make sure you discuss any questions you have with your health care provider. Document Released: 05/15/2017 Document Revised: 10/23/2018 Document Reviewed: 05/15/2017 Elsevier Patient Education  2020 [employer]  

## 2019-04-28 ENCOUNTER — Other Ambulatory Visit: Payer: Self-pay | Admitting: Family

## 2019-04-28 LAB — CBC WITH DIFFERENTIAL/PLATELET
Basophils Absolute: 0.1 10*3/uL (ref 0.0–0.2)
Basos: 1 %
EOS (ABSOLUTE): 0.2 10*3/uL (ref 0.0–0.4)
Eos: 3 %
Hematocrit: 37.7 % (ref 34.0–46.6)
Hemoglobin: 12.6 g/dL (ref 11.1–15.9)
Immature Grans (Abs): 0 10*3/uL (ref 0.0–0.1)
Immature Granulocytes: 0 %
Lymphocytes Absolute: 2.2 10*3/uL (ref 0.7–3.1)
Lymphs: 37 %
MCH: 31.1 pg (ref 26.6–33.0)
MCHC: 33.4 g/dL (ref 31.5–35.7)
MCV: 93 fL (ref 79–97)
Monocytes Absolute: 0.4 10*3/uL (ref 0.1–0.9)
Monocytes: 8 %
Neutrophils Absolute: 3 10*3/uL (ref 1.4–7.0)
Neutrophils: 51 %
Platelets: 229 10*3/uL (ref 150–450)
RBC: 4.05 x10E6/uL (ref 3.77–5.28)
RDW: 12.9 % (ref 11.7–15.4)
WBC: 5.8 10*3/uL (ref 3.4–10.8)

## 2019-04-28 LAB — LIPID PANEL
Chol/HDL Ratio: 3.6 ratio (ref 0.0–4.4)
Cholesterol, Total: 291 mg/dL — ABNORMAL HIGH (ref 100–199)
HDL: 81 mg/dL (ref >39)
LDL Chol Calc (NIH): 183 mg/dL — ABNORMAL HIGH (ref 0–99)
Triglycerides: 152 mg/dL — ABNORMAL HIGH (ref 0–149)
VLDL Cholesterol Cal: 27 mg/dL (ref 5–40)

## 2019-04-28 LAB — CMP14+EGFR
ALT: 12 IU/L (ref 0–32)
AST: 18 IU/L (ref 0–40)
Albumin/Globulin Ratio: 1.2 (ref 1.2–2.2)
Albumin: 4.2 g/dL (ref 3.6–4.6)
Alkaline Phosphatase: 76 IU/L (ref 39–117)
BUN/Creatinine Ratio: 18 (ref 12–28)
BUN: 16 mg/dL (ref 8–27)
Bilirubin Total: 0.5 mg/dL (ref 0.0–1.2)
CO2: 27 mmol/L (ref 20–29)
Calcium: 8.9 mg/dL (ref 8.7–10.3)
Chloride: 100 mmol/L (ref 96–106)
Creatinine, Ser: 0.91 mg/dL (ref 0.57–1.00)
GFR calc Af Amer: 67 mL/min/{1.73_m2} (ref >59)
GFR calc non Af Amer: 58 mL/min/{1.73_m2} — ABNORMAL LOW (ref >59)
Globulin, Total: 3.4 g/dL (ref 1.5–4.5)
Glucose: 88 mg/dL (ref 65–99)
Potassium: 3.8 mmol/L (ref 3.5–5.2)
Sodium: 140 mmol/L (ref 134–144)
Total Protein: 7.6 g/dL (ref 6.0–8.5)

## 2019-04-28 LAB — VITAMIN D 25 HYDROXY (VIT D DEFICIENCY, FRACTURES): Vit D, 25-Hydroxy: 55.1 ng/mL (ref 30.0–100.0)

## 2019-04-28 LAB — TSH: TSH: 3.88 u[IU]/mL (ref 0.450–4.500)

## 2019-04-28 MED ORDER — ATORVASTATIN CALCIUM 20 MG PO TABS: 20.0000 mg | ORAL_TABLET | Freq: Every day | ORAL | 3 refills | Status: DC

## 2019-04-30 DIAGNOSIS — M5030 Other cervical disc degeneration, unspecified cervical region: Secondary | ICD-10-CM | POA: Diagnosis not present

## 2019-04-30 DIAGNOSIS — M0589 Other rheumatoid arthritis with rheumatoid factor of multiple sites: Secondary | ICD-10-CM | POA: Diagnosis not present

## 2019-04-30 DIAGNOSIS — M1990 Unspecified osteoarthritis, unspecified site: Secondary | ICD-10-CM | POA: Diagnosis not present

## 2019-04-30 DIAGNOSIS — E7800 Pure hypercholesterolemia, unspecified: Secondary | ICD-10-CM | POA: Diagnosis not present

## 2019-04-30 DIAGNOSIS — M79641 Pain in right hand: Secondary | ICD-10-CM | POA: Diagnosis not present

## 2019-07-14 DIAGNOSIS — Z23 Encounter for immunization: Secondary | ICD-10-CM | POA: Diagnosis not present

## 2019-07-14 DIAGNOSIS — T23112A Burn of first degree of left thumb (nail), initial encounter: Secondary | ICD-10-CM | POA: Diagnosis not present

## 2019-07-21 ENCOUNTER — Telehealth: Payer: Self-pay | Admitting: Family

## 2019-07-21 NOTE — Telephone Encounter (Signed)
appt made with acute provider on 07/22/19

## 2019-07-22 ENCOUNTER — Ambulatory Visit (INDEPENDENT_AMBULATORY_CARE_PROVIDER_SITE_OTHER): Payer: Medicare Other | Admitting: Family Medicine

## 2019-07-22 ENCOUNTER — Other Ambulatory Visit: Payer: Self-pay

## 2019-07-22 ENCOUNTER — Encounter: Payer: Self-pay | Admitting: Family Medicine

## 2019-07-22 VITALS — BP 137/82 | HR 81 | Temp 97.8°F | Ht <= 58 in | Wt 122.0 lb

## 2019-07-22 DIAGNOSIS — T148XXA Other injury of unspecified body region, initial encounter: Secondary | ICD-10-CM

## 2019-07-22 DIAGNOSIS — T23002A Burn of unspecified degree of left hand, unspecified site, initial encounter: Secondary | ICD-10-CM

## 2019-07-22 NOTE — Patient Instructions (Signed)
I think that the lump that you are feeling in your finger is actually a blister from the barn.  I want you to start using ice on the area or freeze aloe vera gel and apply to the affected area several times daily.  We talked about soft tissue massage.  I would hesitate to drain this given increased risk of infection.  If your symptoms do not improve I want you to get checked out again.  Blisters, Adult  A blister is a raised bubble of skin filled with liquid. Blisters often develop in an area of the skin that repeatedly rubs or presses against another surface (friction blister). Friction blisters can occur on any part of the body, but they usually develop on the hands or feet. Long-term pressure on the same area of the skin can also lead to areas of hardened skin (calluses). What are the causes? A blister can be caused by:  An injury.  A burn.  An allergic reaction.  An infection.  Exposure to irritating chemicals.  Friction, especially in an area with a lot of heat and moisture. Friction blisters often result from:  Sports.  Repetitive activities.  Using tools and doing other activities without wearing gloves.  Shoes that are too tight or too loose. What are the signs or symptoms? A blister is often round and looks like a bump. It may:  Itch.  Be painful to the touch. Before a blister forms, the skin may:  Become red.  Feel warm.  Itch.  Be painful to the touch. How is this diagnosed? A blister is diagnosed with a physical exam. How is this treated? Treatment usually involves protecting the area where the blister has formed until the skin has healed. Other treatments may include:  A bandage (dressing) to cover the blister.  Extra padding around and over the blister, so that it does not rub on anything.  Antibiotic ointment. Most blisters break open, dry up, and go away on their own within 1-2 weeks. Blisters that are very painful may be drained before they break  open on their own. If the blister is large or painful, it can be drained by: 1. Sterilizing a small needle with rubbing alcohol. 2. Washing your hands with soap and water. 3. Inserting the needle in the edge of the blister to make a small hole. Some fluid will drain out of the hole. Let the top or roof of the blister stay in place. This helps the skin heal. 4. Washing the blister with mild soap and water. 5. Covering the blister with antibiotic ointment, if prescribed by your health care provider, and a dressing. Some blisters may need to be drained by a health care provider. Follow these instructions at home:  Protect the area where the blister has formed as told by your health care provider.  Keep your blister clean and dry. This helps to prevent infection.  Do not pop your blister. This can cause infection.  If you were prescribed an antibiotic, use it as told by your health care provider. Do not stop using the antibiotic even if your condition improves.  Wear different shoes until the blister heals.  Avoid the activity that caused the blister until your blister heals.  Check your blister every day for signs of infection. Check for: ? More redness, swelling, or pain. ? More fluid or blood. ? Warmth. ? Pus or a bad smell. ? The blister getting better and then getting worse. How is this prevented? Taking  these steps can help to prevent blisters that are caused by friction. Make sure you:  Wear comfortable shoes that fit well.  Always wear socks with shoes.  Wear extra socks or use tape, bandages, or pads over blister-prone areas as needed. You may also apply petroleum jelly under bandages in blister-prone areas.  Wear protective gear, such as gloves, when participating in sports or activities that can cause blisters.  Wear loose-fitting, moisture-wicking clothes when participating in sports or activities.  Use powders as needed to keep your feet dry. Contact a health care  provider if:  You have more redness, swelling, or pain around your blister.  You have more fluid or blood coming from your blister.  Your blister feels warm to the touch.  You have pus or a bad smell coming from your blister.  You have a fever or chills.  Your blister gets better and then it gets worse. This information is not intended to replace advice given to you by your health care provider. Make sure you discuss any questions you have with your health care provider. Document Revised: 06/14/2017 Document Reviewed: 01/13/2016 Elsevier Patient Education  2020 [employer].

## 2019-07-22 NOTE — Progress Notes (Signed)
Subjective: CC: Burn PCP: Sharion Balloon, FNP [id number] Ashley Nelson is a 86 y.o. female presenting to clinic today for:  1.  Burn Patient sustained a burn to her left palmar aspect of the thumb.  This occurred about a week and a half ago when she accidentally put her hand on a stove which was still on.  She immediately felt pain and sought care at the urgent care.  She was given Silvadene cream but notes that the skin never broke down.  The swelling has gotten somewhat better but she still has a palpable knots on her thumb and wanted to have this checked out.  She has not been applying ice to the affected area   ROS: Per HPI  No Known Allergies Past Medical History:  Diagnosis Date  . Cancer ([place])    cervical?  . Hypertension   . Osteoporosis   . RA (rheumatoid arthritis) ([place]) 2015  . Thyroid disease     Current Outpatient Medications:  .  atorvastatin (LIPITOR) 20 MG tablet, Take 1 tablet (20 mg total) by mouth daily., Disp: 90 tablet, Rfl: 3 .  hydrochlorothiazide (HYDRODIURIL) 12.5 MG tablet, Take 1 tablet (12.5 mg total) by mouth daily., Disp: 90 tablet, Rfl: 2 .  levothyroxine (SYNTHROID) 50 MCG tablet, Take 1 tablet (50 mcg total) by mouth daily., Disp: 90 tablet, Rfl: 3 .  silver sulfADIAZINE (SILVADENE) 1 % cream, Apply to affected area daily, Disp: , Rfl:  Social History   Socioeconomic History  . Marital status: Widowed    Spouse name: Not on file  . Number of children: Not on file  . Years of education: Not on file  . Highest education level: Not on file  Occupational History  . Not on file  Tobacco Use  . Smoking status: Never Smoker  . Smokeless tobacco: Never Used  Substance and Sexual Activity  . Alcohol use: No  . Drug use: No  . Sexual activity: Not on file  Other Topics Concern  . Not on file  Social History Narrative  . Not on file   Social Determinants of Health   Financial Resource Strain:   . Difficulty of Paying Living Expenses: Not on  file  Food Insecurity:   . Worried About Charity fundraiser in the Last Year: Not on file  . Ran Out of Food in the Last Year: Not on file  Transportation Needs:   . Lack of Transportation (Medical): Not on file  . Lack of Transportation (Non-Medical): Not on file  Physical Activity:   . Days of Exercise per Week: Not on file  . Minutes of Exercise per Session: Not on file  Stress:   . Feeling of Stress : Not on file  Social Connections:   . Frequency of Communication with Friends and Family: Not on file  . Frequency of Social Gatherings with Friends and Family: Not on file  . Attends Religious Services: Not on file  . Active Member of Clubs or Organizations: Not on file  . Attends Archivist Meetings: Not on file  . Marital Status: Not on file  Intimate Partner Violence:   . Fear of Current or Ex-Partner: Not on file  . Emotionally Abused: Not on file  . Physically Abused: Not on file  . Sexually Abused: Not on file   No family history on file.  Objective: Office vital signs reviewed. BP 137/82   Pulse 81   Temp 97.8 F (36.6 C) (Temporal)  Ht 4\' 10"  (1.473 m)   Wt 122 lb (55.3 kg)   SpO2 97%   BMI 25.50 kg/m   Physical Examination:  General: Awake, alert, well nourished, No acute distress MSK: Palmar aspect of the left thumb with a bean sized palpable fluid-filled lump.  There is no skin breakdown or evidence of skin necrosis.  The lump is not especially tender.  Assessment/ Plan: 86 y.o. female   1. Blister I think this is likely a deeper blister related to recent burn.  I have recommended that she apply ice and apply massage to the area.  I would hesitate to drain this area given increased risk of infection.  Nothing to suggest infection on today's exam.  No use for the Silvadene cream at this point given lack of breakdown of skin.  We discussed if symptoms do not improve or if she develops any other worrisome symptoms or signs she is to seek  reevaluation.   No orders of the defined types were placed in this encounter.  No orders of the defined types were placed in this encounter.    Janora Norlander, DO [facility] [phone]

## 2019-10-26 ENCOUNTER — Ambulatory Visit: Payer: Medicare Other | Admitting: Family

## 2019-10-26 ENCOUNTER — Other Ambulatory Visit: Payer: Self-pay | Admitting: *Deleted

## 2019-10-26 DIAGNOSIS — E039 Hypothyroidism, unspecified: Secondary | ICD-10-CM

## 2019-10-26 MED ORDER — LEVOTHYROXINE SODIUM 50 MCG PO TABS: 50.0000 ug | ORAL_TABLET | Freq: Every day | ORAL | 0 refills | Status: DC

## 2019-10-29 ENCOUNTER — Telehealth: Payer: Self-pay | Admitting: Family

## 2019-10-29 NOTE — Chronic Care Management (AMB) (Signed)
  Chronic Care Management   Note  10/29/2019 Name: Ashley Nelson MRN: [id number] DOB: 29-Apr-1933  Ashley Nelson is a 86 y.o. year old female who is a primary care patient of Sharion Balloon, FNP. I reached out to [name] by phone today in response to a referral sent by [name]'s health plan.     [name] was given information about Chronic Care Management services today including:  1. CCM service includes personalized support from designated clinical staff supervised by her physician, including individualized plan of care and coordination with other care providers 2. 24/7 contact phone numbers for assistance for urgent and routine care needs. 3. Service will only be billed when office clinical staff spend 20 minutes or more in a month to coordinate care. 4. Only one practitioner may furnish and bill the service in a calendar month. 5. The patient may stop CCM services at any time (effective at the end of the month) by phone call to the office staff. 6. The patient will be responsible for cost sharing (co-pay) of up to 20% of the service fee (after annual deductible is met).  Patient did not agree to enrollment in care management services and does not wish to consider at this time.  Follow up plan: The care management team is available to follow up with the patient after provider conversation with the patient regarding recommendation for care management engagement and subsequent re-referral to the care management team. The patient has been provided with contact information for the care management team and has been advised to call with any health related questions or concerns.   [place], [place] [address] Direct Dial: [phone] [email or web address]: [email or web address]

## 2019-11-06 ENCOUNTER — Ambulatory Visit (INDEPENDENT_AMBULATORY_CARE_PROVIDER_SITE_OTHER): Payer: Medicare Other | Admitting: Family

## 2019-11-06 ENCOUNTER — Encounter: Payer: Self-pay | Admitting: Family

## 2019-11-06 ENCOUNTER — Other Ambulatory Visit: Payer: Self-pay

## 2019-11-06 VITALS — BP 138/82 | Temp 97.9°F | Ht <= 58 in | Wt 121.2 lb

## 2019-11-06 DIAGNOSIS — I10 Essential (primary) hypertension: Secondary | ICD-10-CM | POA: Diagnosis not present

## 2019-11-06 DIAGNOSIS — E039 Hypothyroidism, unspecified: Secondary | ICD-10-CM | POA: Diagnosis not present

## 2019-11-06 DIAGNOSIS — M810 Age-related osteoporosis without current pathological fracture: Secondary | ICD-10-CM

## 2019-11-06 DIAGNOSIS — E559 Vitamin D deficiency, unspecified: Secondary | ICD-10-CM | POA: Diagnosis not present

## 2019-11-06 DIAGNOSIS — K219 Gastro-esophageal reflux disease without esophagitis: Secondary | ICD-10-CM

## 2019-11-06 DIAGNOSIS — E785 Hyperlipidemia, unspecified: Secondary | ICD-10-CM

## 2019-11-06 DIAGNOSIS — M0579 Rheumatoid arthritis with rheumatoid factor of multiple sites without organ or systems involvement: Secondary | ICD-10-CM | POA: Diagnosis not present

## 2019-11-06 NOTE — Patient Instructions (Signed)
Health Maintenance After Age 84 After age 84, you are at a higher risk for certain long-term diseases and infections as well as injuries from falls. Falls are a major cause of broken bones and head injuries in people who are older than age 84. Getting regular preventive care can help to keep you healthy and well. Preventive care includes getting regular testing and making lifestyle changes as recommended by your health care provider. Talk with your health care provider about:  Which screenings and tests you should have. A screening is a test that checks for a disease when you have no symptoms.  A diet and exercise plan that is right for you. What should I know about screenings and tests to prevent falls? Screening and testing are the best ways to find a health problem early. Early diagnosis and treatment give you the best chance of managing medical conditions that are common after age 84. Certain conditions and lifestyle choices may make you more likely to have a fall. Your health care provider may recommend:  Regular vision checks. Poor vision and conditions such as cataracts can make you more likely to have a fall. If you wear glasses, make sure to get your prescription updated if your vision changes.  Medicine review. Work with your health care provider to regularly review all of the medicines you are taking, including over-the-counter medicines. Ask your health care provider about any side effects that may make you more likely to have a fall. Tell your health care provider if any medicines that you take make you feel dizzy or sleepy.  Osteoporosis screening. Osteoporosis is a condition that causes the bones to get weaker. This can make the bones weak and cause them to break more easily.  Blood pressure screening. Blood pressure changes and medicines to control blood pressure can make you feel dizzy.  Strength and balance checks. Your health care provider may recommend certain tests to check your  strength and balance while standing, walking, or changing positions.  Foot health exam. Foot pain and numbness, as well as not wearing proper footwear, can make you more likely to have a fall.  Depression screening. You may be more likely to have a fall if you have a fear of falling, feel emotionally low, or feel unable to do activities that you used to do.  Alcohol use screening. Using too much alcohol can affect your balance and may make you more likely to have a fall. What actions can I take to lower my risk of falls? General instructions  Talk with your health care provider about your risks for falling. Tell your health care provider if: ? You fall. Be sure to tell your health care provider about all falls, even ones that seem minor. ? You feel dizzy, sleepy, or off-balance.  Take over-the-counter and prescription medicines only as told by your health care provider. These include any supplements.  Eat a healthy diet and maintain a healthy weight. A healthy diet includes low-fat dairy products, low-fat (lean) meats, and fiber from whole grains, beans, and lots of fruits and vegetables. Home safety  Remove any tripping hazards, such as rugs, cords, and clutter.  Install safety equipment such as grab bars in bathrooms and safety rails on stairs.  Keep rooms and walkways well-lit. Activity   Follow a regular exercise program to stay fit. This will help you maintain your balance. Ask your health care provider what types of exercise are appropriate for you.  If you need a cane or   walker, use it as recommended by your health care provider.  Wear supportive shoes that have nonskid soles. Lifestyle  Do not drink alcohol if your health care provider tells you not to drink.  If you drink alcohol, limit how much you have: ? 0-1 drink a day for women. ? 0-2 drinks a day for men.  Be aware of how much alcohol is in your drink. In the [place] one drink equals one typical bottle of beer (12  oz), one-half glass of wine (5 oz), or one shot of hard liquor (1 oz).  Do not use any products that contain nicotine or tobacco, such as cigarettes and e-cigarettes. If you need help quitting, ask your health care provider. Summary  Having a healthy lifestyle and getting preventive care can help to protect your health and wellness after age 84.  Screening and testing are the best way to find a health problem early and help you avoid having a fall. Early diagnosis and treatment give you the best chance for managing medical conditions that are more common for people who are older than age 84.  Falls are a major cause of broken bones and head injuries in people who are older than age 84. Take precautions to prevent a fall at home.  Work with your health care provider to learn what changes you can make to improve your health and wellness and to prevent falls. This information is not intended to replace advice given to you by your health care provider. Make sure you discuss any questions you have with your health care provider. Document Revised: 10/23/2018 Document Reviewed: 05/15/2017 Elsevier Patient Education  2020 [employer] Inc[employer]  

## 2019-11-06 NOTE — Progress Notes (Signed)
Subjective:    Patient ID: Ashley Nelson, female    DOB: [birth year], 86 y.o.   MRN: [id number]  Chief Complaint  Patient presents with  . Medical Management of Chronic Issues   Pt presents to the office today for chronic follow up. Pt has rheumatoid arthritis and is followed by Rheumatologists every21month. Hypertension This is a chronic problem. The current episode started more than 1 year ago. The problem has been resolved since onset. The problem is controlled. Pertinent negatives include no malaise/fatigue, peripheral edema or shortness of breath. Risk factors for coronary artery disease include dyslipidemia and sedentary lifestyle. Past treatments include diuretics. The current treatment provides moderate improvement. There is no history of kidney disease. Identifiable causes of hypertension include a thyroid problem.  Hyperlipidemia This is a chronic problem. The current episode started more than 1 year ago. The problem is controlled. Recent lipid tests were reviewed and are normal. Pertinent negatives include no shortness of breath. Current antihyperlipidemic treatment includes statins. The current treatment provides moderate improvement of lipids. Risk factors for coronary artery disease include dyslipidemia, hypertension, post-menopausal and a sedentary lifestyle.  Thyroid Problem Presents for follow-up visit. Symptoms include fatigue. Patient reports no constipation, diaphoresis or hoarse voice. The symptoms have been stable. Her past medical history is significant for hyperlipidemia.  Osteoporosis Last Dexa scan 04/08/18.     Review of Systems  Constitutional: Positive for fatigue. Negative for diaphoresis and malaise/fatigue.  HENT: Negative for hoarse voice.   Respiratory: Negative for shortness of breath.   Gastrointestinal: Negative for constipation.  All other systems reviewed and are negative.      Objective:   Physical Exam Vitals reviewed.  Constitutional:    General: She is not in acute distress.    Appearance: She is well-developed.  HENT:     Head: Normocephalic and atraumatic.     Right Ear: Tympanic membrane normal.     Left Ear: Tympanic membrane normal.  Eyes:     Pupils: Pupils are equal, round, and reactive to light.  Neck:     Thyroid: No thyromegaly.  Cardiovascular:     Rate and Rhythm: Normal rate and regular rhythm.     Heart sounds: Normal heart sounds. No murmur.  Pulmonary:     Effort: Pulmonary effort is normal. No respiratory distress.     Breath sounds: Normal breath sounds. No wheezing.  Abdominal:     General: Bowel sounds are normal. There is no distension.     Palpations: Abdomen is soft.     Tenderness: There is no abdominal tenderness.  Musculoskeletal:        General: No tenderness. Normal range of motion.     Cervical back: Normal range of motion and neck supple.  Skin:    General: Skin is warm and dry.  Neurological:     Mental Status: She is alert and oriented to person, place, and time.     Cranial Nerves: No cranial nerve deficit.     Deep Tendon Reflexes: Reflexes are normal and symmetric.  Psychiatric:        Behavior: Behavior normal.        Thought Content: Thought content normal.        Judgment: Judgment normal.       BP 138/82   Temp 97.9 F (36.6 C) (Temporal)   Ht 4' 10"  (1.473 m)   Wt 121 lb 3.2 oz (55 kg)   BMI 25.33 kg/m      Assessment & Plan:  Ashley Nelson comes in today with chief complaint of Medical Management of Chronic Issues   Diagnosis and orders addressed:  1. Essential hypertension - CMP14+EGFR  2. Gastroesophageal reflux disease, unspecified whether esophagitis present - CMP14+EGFR  3. Hypothyroidism, unspecified type - CMP14+EGFR - TSH  4. Rheumatoid arthritis involving multiple sites with positive rheumatoid factor (HCC) - CMP14+EGFR  5. Vitamin D deficiency - CMP14+EGFR  6. Hyperlipidemia, unspecified hyperlipidemia type - CMP14+EGFR  7.  Osteoporosis, unspecified osteoporosis type, unspecified pathological fracture presence Weight bearing exercises  - DG [facility] DEXA  Labs pending Health Maintenance reviewed Diet and exercise encouraged  Follow up plan: 6 months    [name], FNP

## 2019-11-07 LAB — CMP14+EGFR
ALT: 14 IU/L (ref 0–32)
AST: 22 IU/L (ref 0–40)
Albumin/Globulin Ratio: 1.2 (ref 1.2–2.2)
Albumin: 4.3 g/dL (ref 3.6–4.6)
Alkaline Phosphatase: 80 IU/L (ref 39–117)
BUN/Creatinine Ratio: 18 (ref 12–28)
BUN: 15 mg/dL (ref 8–27)
Bilirubin Total: 0.4 mg/dL (ref 0.0–1.2)
CO2: 25 mmol/L (ref 20–29)
Calcium: 9.3 mg/dL (ref 8.7–10.3)
Chloride: 99 mmol/L (ref 96–106)
Creatinine, Ser: 0.83 mg/dL (ref 0.57–1.00)
GFR calc Af Amer: 75 mL/min/{1.73_m2} (ref >59)
GFR calc non Af Amer: 65 mL/min/{1.73_m2} (ref >59)
Globulin, Total: 3.5 g/dL (ref 1.5–4.5)
Glucose: 82 mg/dL (ref 65–99)
Potassium: 3.9 mmol/L (ref 3.5–5.2)
Sodium: 142 mmol/L (ref 134–144)
Total Protein: 7.8 g/dL (ref 6.0–8.5)

## 2019-11-07 LAB — TSH: TSH: 4.21 u[IU]/mL (ref 0.450–4.500)

## 2019-11-11 DIAGNOSIS — M1990 Unspecified osteoarthritis, unspecified site: Secondary | ICD-10-CM | POA: Diagnosis not present

## 2019-11-11 DIAGNOSIS — M5030 Other cervical disc degeneration, unspecified cervical region: Secondary | ICD-10-CM | POA: Diagnosis not present

## 2019-11-11 DIAGNOSIS — M79641 Pain in right hand: Secondary | ICD-10-CM | POA: Diagnosis not present

## 2019-11-11 DIAGNOSIS — M79642 Pain in left hand: Secondary | ICD-10-CM | POA: Diagnosis not present

## 2019-11-11 DIAGNOSIS — M0589 Other rheumatoid arthritis with rheumatoid factor of multiple sites: Secondary | ICD-10-CM | POA: Diagnosis not present

## 2020-01-12 ENCOUNTER — Encounter: Payer: Self-pay | Admitting: Family

## 2020-01-12 ENCOUNTER — Ambulatory Visit (INDEPENDENT_AMBULATORY_CARE_PROVIDER_SITE_OTHER): Payer: Medicare Other | Admitting: Family

## 2020-01-12 DIAGNOSIS — L255 Unspecified contact dermatitis due to plants, except food: Secondary | ICD-10-CM | POA: Diagnosis not present

## 2020-01-12 MED ORDER — TRIAMCINOLONE ACETONIDE 0.5 % EX OINT: 1.0000 "application " | TOPICAL_OINTMENT | Freq: Two times a day (BID) | CUTANEOUS | 0 refills | Status: DC

## 2020-01-12 MED ORDER — PREDNISONE 10 MG (21) PO TBPK: ORAL_TABLET | ORAL | 0 refills | Status: DC

## 2020-01-12 NOTE — Progress Notes (Signed)
   Virtual Visit via telephone Note Due to COVID-19 pandemic this visit was conducted virtually. This visit type was conducted due to national recommendations for restrictions regarding the COVID-19 Pandemic (e.g. social distancing, sheltering in place) in an effort to limit this patient's exposure and mitigate transmission in our community. All issues noted in this document were discussed and addressed.  A physical exam was not performed with this format.  I connected with Ashley Nelson on 01/12/20 at 10:15 AM  by telephone and verified that I am speaking with the correct person using two identifiers. Ashley Nelson is currently located at home and no one is currently with her during visit. The provider, [name], FNP is located in their office at time of visit.  I discussed the limitations, risks, security and privacy concerns of performing an evaluation and management service by telephone and the availability of in person appointments. I also discussed with the patient that there may be a patient responsible charge related to this service. The patient expressed understanding and agreed to proceed.   History and Present Illness:  Rash This is a new problem. The current episode started in the past 7 days. The problem has been gradually worsening since onset. The affected locations include the left upper leg, left lower leg, right upper leg and right lower leg. The rash is characterized by itchiness, blistering and redness. She was exposed to plant contact. Pertinent negatives include no congestion, cough, diarrhea, fatigue, fever, shortness of breath or sore throat. Past treatments include anti-itch cream, antihistamine and topical steroids. The treatment provided mild relief. There is no history of asthma.      Review of Systems  Constitutional: Negative for fatigue and fever.  HENT: Negative for congestion and sore throat.   Respiratory: Negative for cough and shortness of breath.     Gastrointestinal: Negative for diarrhea.  Skin: Positive for rash.     Observations/Objective: No SOB or distress noted   Assessment and Plan: 1. Contact dermatitis due to plant -Do not scratch -Wear protective clothing while outside- Long sleeves and long pants -Take a shower as soon as possible after being outside Report any s/s of infection RTO if symptoms worsen or do not improve  - predniSONE (STERAPRED UNI-PAK 21 TAB) 10 MG (21) TBPK tablet; Use as directed  Dispense: 21 tablet; Refill: 0 - triamcinolone ointment (KENALOG) 0.5 %; Apply 1 application topically 2 (two) times daily.  Dispense: 30 g; Refill: 0     I discussed the assessment and treatment plan with the patient. The patient was provided an opportunity to ask questions and all were answered. The patient agreed with the plan and demonstrated an understanding of the instructions.   The patient was advised to call back or seek an in-person evaluation if the symptoms worsen or if the condition fails to improve as anticipated.  The above assessment and management plan was discussed with the patient. The patient verbalized understanding of and has agreed to the management plan. Patient is aware to call the clinic if symptoms persist or worsen. Patient is aware when to return to the clinic for a follow-up visit. Patient educated on when it is appropriate to go to the emergency department.   Time call ended:  10:24 AM  I provided 9 minutes of non-face-to-face time during this encounter.    [name], FNP

## 2020-01-27 ENCOUNTER — Other Ambulatory Visit: Payer: Self-pay | Admitting: Family

## 2020-01-27 DIAGNOSIS — I10 Essential (primary) hypertension: Secondary | ICD-10-CM

## 2020-02-01 DIAGNOSIS — J0100 Acute maxillary sinusitis, unspecified: Secondary | ICD-10-CM | POA: Diagnosis not present

## 2020-02-04 DIAGNOSIS — M0589 Other rheumatoid arthritis with rheumatoid factor of multiple sites: Secondary | ICD-10-CM | POA: Diagnosis not present

## 2020-02-04 DIAGNOSIS — Z79899 Other long term (current) drug therapy: Secondary | ICD-10-CM | POA: Diagnosis not present

## 2020-02-10 DIAGNOSIS — I10 Essential (primary) hypertension: Secondary | ICD-10-CM | POA: Diagnosis not present

## 2020-02-10 DIAGNOSIS — M5030 Other cervical disc degeneration, unspecified cervical region: Secondary | ICD-10-CM | POA: Diagnosis not present

## 2020-02-10 DIAGNOSIS — M0589 Other rheumatoid arthritis with rheumatoid factor of multiple sites: Secondary | ICD-10-CM | POA: Diagnosis not present

## 2020-02-10 DIAGNOSIS — M1990 Unspecified osteoarthritis, unspecified site: Secondary | ICD-10-CM | POA: Diagnosis not present

## 2020-02-10 DIAGNOSIS — E7800 Pure hypercholesterolemia, unspecified: Secondary | ICD-10-CM | POA: Diagnosis not present

## 2020-03-09 ENCOUNTER — Other Ambulatory Visit: Payer: Self-pay | Admitting: Family

## 2020-03-09 DIAGNOSIS — E039 Hypothyroidism, unspecified: Secondary | ICD-10-CM

## 2020-03-09 MED ORDER — LEVOTHYROXINE SODIUM 50 MCG PO TABS: 50.0000 ug | ORAL_TABLET | Freq: Every day | ORAL | 2 refills | Status: DC

## 2020-03-09 NOTE — Telephone Encounter (Signed)
NA/NVM, refill sent to pharmacy 

## 2020-03-09 NOTE — Telephone Encounter (Signed)
°  Prescription Request  03/09/2020  What is the name of the medication or equipment? levothyroxine (SYNTHROID) 50 MCG tablet   Have you contacted your pharmacy to request a refill? (if applicable) no  Which pharmacy would you like this sent to? [facility] in [place]   Patient notified that their request is being sent to the clinical staff for review and that they should receive a response within 2 business days.

## 2020-05-16 DIAGNOSIS — M1990 Unspecified osteoarthritis, unspecified site: Secondary | ICD-10-CM | POA: Diagnosis not present

## 2020-05-16 DIAGNOSIS — I10 Essential (primary) hypertension: Secondary | ICD-10-CM | POA: Diagnosis not present

## 2020-05-16 DIAGNOSIS — M25862 Other specified joint disorders, left knee: Secondary | ICD-10-CM | POA: Diagnosis not present

## 2020-05-16 DIAGNOSIS — M1711 Unilateral primary osteoarthritis, right knee: Secondary | ICD-10-CM | POA: Diagnosis not present

## 2020-05-16 DIAGNOSIS — M0589 Other rheumatoid arthritis with rheumatoid factor of multiple sites: Secondary | ICD-10-CM | POA: Diagnosis not present

## 2020-05-16 DIAGNOSIS — M25561 Pain in right knee: Secondary | ICD-10-CM | POA: Diagnosis not present

## 2020-05-16 DIAGNOSIS — M1712 Unilateral primary osteoarthritis, left knee: Secondary | ICD-10-CM | POA: Diagnosis not present

## 2020-05-16 DIAGNOSIS — M5030 Other cervical disc degeneration, unspecified cervical region: Secondary | ICD-10-CM | POA: Diagnosis not present

## 2020-05-16 DIAGNOSIS — M25861 Other specified joint disorders, right knee: Secondary | ICD-10-CM | POA: Diagnosis not present

## 2020-05-16 DIAGNOSIS — M25761 Osteophyte, right knee: Secondary | ICD-10-CM | POA: Diagnosis not present

## 2020-05-16 DIAGNOSIS — E7800 Pure hypercholesterolemia, unspecified: Secondary | ICD-10-CM | POA: Diagnosis not present

## 2020-06-27 ENCOUNTER — Other Ambulatory Visit: Payer: Self-pay | Admitting: Family

## 2020-06-27 DIAGNOSIS — I10 Essential (primary) hypertension: Secondary | ICD-10-CM

## 2020-08-19 ENCOUNTER — Telehealth: Payer: Self-pay

## 2020-08-30 DIAGNOSIS — E7800 Pure hypercholesterolemia, unspecified: Secondary | ICD-10-CM | POA: Diagnosis not present

## 2020-08-30 DIAGNOSIS — M1990 Unspecified osteoarthritis, unspecified site: Secondary | ICD-10-CM | POA: Diagnosis not present

## 2020-08-30 DIAGNOSIS — M79643 Pain in unspecified hand: Secondary | ICD-10-CM | POA: Diagnosis not present

## 2020-08-30 DIAGNOSIS — M0589 Other rheumatoid arthritis with rheumatoid factor of multiple sites: Secondary | ICD-10-CM | POA: Diagnosis not present

## 2020-08-30 DIAGNOSIS — M5030 Other cervical disc degeneration, unspecified cervical region: Secondary | ICD-10-CM | POA: Diagnosis not present

## 2020-08-30 DIAGNOSIS — I10 Essential (primary) hypertension: Secondary | ICD-10-CM | POA: Diagnosis not present

## 2020-09-06 ENCOUNTER — Ambulatory Visit (INDEPENDENT_AMBULATORY_CARE_PROVIDER_SITE_OTHER): Payer: Medicare Other | Admitting: Family

## 2020-09-06 ENCOUNTER — Other Ambulatory Visit: Payer: Self-pay

## 2020-09-06 ENCOUNTER — Encounter: Payer: Self-pay | Admitting: Family

## 2020-09-06 VITALS — BP 131/72 | HR 76 | Temp 97.7°F | Ht <= 58 in | Wt 126.4 lb

## 2020-09-06 DIAGNOSIS — N3281 Overactive bladder: Secondary | ICD-10-CM | POA: Diagnosis not present

## 2020-09-06 DIAGNOSIS — E785 Hyperlipidemia, unspecified: Secondary | ICD-10-CM | POA: Diagnosis not present

## 2020-09-06 DIAGNOSIS — I10 Essential (primary) hypertension: Secondary | ICD-10-CM | POA: Diagnosis not present

## 2020-09-06 DIAGNOSIS — E559 Vitamin D deficiency, unspecified: Secondary | ICD-10-CM | POA: Diagnosis not present

## 2020-09-06 DIAGNOSIS — M0579 Rheumatoid arthritis with rheumatoid factor of multiple sites without organ or systems involvement: Secondary | ICD-10-CM | POA: Diagnosis not present

## 2020-09-06 DIAGNOSIS — K219 Gastro-esophageal reflux disease without esophagitis: Secondary | ICD-10-CM

## 2020-09-06 DIAGNOSIS — E039 Hypothyroidism, unspecified: Secondary | ICD-10-CM

## 2020-09-06 MED ORDER — MIRABEGRON ER 25 MG PO TB24: 25.0000 mg | ORAL_TABLET | Freq: Every day | ORAL | 1 refills | Status: DC

## 2020-09-06 MED ORDER — ATORVASTATIN CALCIUM 20 MG PO TABS: 20.0000 mg | ORAL_TABLET | Freq: Every day | ORAL | 3 refills | Status: DC

## 2020-09-06 NOTE — Progress Notes (Signed)
Subjective:    Patient ID: Ashley Nelson, female    DOB: 1933-04-17, 87 y.o.   MRN: [id number]  Chief Complaint  Patient presents with  . Medication Refill   Pt presents to the office today for chronic follow up. Pt has rheumatoid arthritis and is followed by Rheumatologists every65month. She is currently on methotrexate.  Hypertension This is a chronic problem. The current episode started more than 1 year ago. The problem has been resolved since onset. The problem is controlled. Pertinent negatives include no malaise/fatigue, palpitations, peripheral edema or shortness of breath. Risk factors for coronary artery disease include dyslipidemia and sedentary lifestyle. The current treatment provides moderate improvement. There is no history of CVA or heart failure. Identifiable causes of hypertension include a thyroid problem.  Gastroesophageal Reflux She complains of belching and heartburn. This is a chronic problem. The current episode started more than 1 year ago. The problem occurs rarely. Pertinent negatives include no fatigue. She has tried an antacid for the symptoms. The treatment provided moderate relief.  Thyroid Problem Presents for follow-up visit. Symptoms include constipation and hair loss. Patient reports no diaphoresis, diarrhea, fatigue, menstrual problem or palpitations. The symptoms have been stable. Her past medical history is significant for hyperlipidemia. There is no history of heart failure.  Arthritis Presents for follow-up visit. She complains of pain and stiffness. The symptoms have been stable. Affected locations include the left knee and right knee. Pertinent negatives include no diarrhea or fatigue.  Hyperlipidemia This is a chronic problem. The current episode started more than 1 year ago. The problem is controlled. Recent lipid tests were reviewed and are normal. Pertinent negatives include no shortness of breath. Current antihyperlipidemic treatment includes diet  change. The current treatment provides mild improvement of lipids. Risk factors for coronary artery disease include dyslipidemia, post-menopausal and a sedentary lifestyle.  Urinary Frequency  The current episode started more than 1 month ago. The problem occurs intermittently. The pain is at a severity of 0/10. The patient is experiencing no pain. Associated symptoms include frequency. Associated symptoms comments: 6-8 times at night.      Review of Systems  Constitutional: Negative for diaphoresis, fatigue and malaise/fatigue.  Respiratory: Negative for shortness of breath.   Cardiovascular: Negative for palpitations.  Gastrointestinal: Positive for constipation and heartburn. Negative for diarrhea.  Genitourinary: Positive for frequency. Negative for menstrual problem.  Musculoskeletal: Positive for arthritis and stiffness.  All other systems reviewed and are negative.      Objective:   Physical Exam Vitals reviewed.  Constitutional:      General: She is not in acute distress.    Appearance: She is well-developed and well-nourished.  HENT:     Head: Normocephalic and atraumatic.     Right Ear: Tympanic membrane normal.     Left Ear: Tympanic membrane normal.     Mouth/Throat:     Mouth: Oropharynx is clear and moist.  Eyes:     Pupils: Pupils are equal, round, and reactive to light.  Neck:     Thyroid: No thyromegaly.  Cardiovascular:     Rate and Rhythm: Normal rate and regular rhythm.     Pulses: Intact distal pulses.     Heart sounds: Normal heart sounds. No murmur heard.   Pulmonary:     Effort: Pulmonary effort is normal. No respiratory distress.     Breath sounds: Normal breath sounds. No wheezing.  Abdominal:     General: Bowel sounds are normal. There is no distension.  Palpations: Abdomen is soft.     Tenderness: There is no abdominal tenderness.  Musculoskeletal:        General: No tenderness or edema. Normal range of motion.     Cervical back: Normal  range of motion and neck supple.  Skin:    General: Skin is warm and dry.  Neurological:     Mental Status: She is alert and oriented to person, place, and time.     Cranial Nerves: No cranial nerve deficit.     Deep Tendon Reflexes: Reflexes are normal and symmetric.  Psychiatric:        Mood and Affect: Mood and affect normal.        Behavior: Behavior normal.        Thought Content: Thought content normal.        Judgment: Judgment normal.       BP 131/72   Pulse 76   Temp 97.7 F (36.5 C) (Temporal)   Ht 4' 10"  (1.473 m)   Wt 126 lb 6.4 oz (57.3 kg)   SpO2 99%   BMI 26.42 kg/m      Assessment & Plan:  Deyona Soza comes in today with chief complaint of Medication Refill   Diagnosis and orders addressed:  1. Essential hypertension - CMP14+EGFR - CBC with Differential/Platelet  2. Gastroesophageal reflux disease, unspecified whether esophagitis present - CMP14+EGFR - CBC with Differential/Platelet  3. Hypothyroidism, unspecified type - CMP14+EGFR - CBC with Differential/Platelet - TSH  4. Rheumatoid arthritis involving multiple sites with positive rheumatoid factor (HCC) - CMP14+EGFR - CBC with Differential/Platelet  5. Hyperlipidemia, unspecified hyperlipidemia type - CMP14+EGFR - CBC with Differential/Platelet - Lipid panel  6. Vitamin D deficiency - CMP14+EGFR - CBC with Differential/Platelet - VITAMIN D 25 Hydroxy (Vit-D Deficiency, Fractures)  7. Overactive bladder Will start Myrbetriq today Avoid caffeine  - CMP14+EGFR - CBC with Differential/Platelet - mirabegron ER (MYRBETRIQ) 25 MG TB24 tablet; Take 1 tablet (25 mg total) by mouth daily.  Dispense: 90 tablet; Refill: 1   Labs pending Health Maintenance reviewed Diet and exercise encouraged  Follow up plan: 6 months    [name], FNP

## 2020-09-06 NOTE — Patient Instructions (Signed)
Overactive Bladder, Adult  Overactive bladder is a condition in which a person has a sudden and frequent need to urinate. A person might also leak urine if he or she cannot get to the bathroom fast enough (urinary incontinence). Sometimes, symptoms can interfere with work or social activities. What are the causes? Overactive bladder is associated with poor nerve signals between your bladder and your brain. Your bladder may get the signal to empty before it is full. You may also have very sensitive muscles that make your bladder squeeze too soon. This condition may also be caused by other factors, such as:  Medical conditions: ? Urinary tract infection. ? Infection of nearby tissues. ? Prostate enlargement. ? Bladder stones, inflammation, or tumors. ? Diabetes. ? Muscle or nerve weakness, especially from these conditions:  A spinal cord injury.  Stroke.  Multiple sclerosis.  Parkinson's disease.  Other causes: ? Surgery on the uterus or urethra. ? Drinking too much caffeine or alcohol. ? Certain medicines, especially those that eliminate extra fluid in the body (diuretics). ? Constipation. What increases the risk? You may be at greater risk for overactive bladder if you:  Are an older adult.  Smoke.  Are going through menopause.  Have prostate problems.  Have a neurological disease, such as stroke, dementia, Parkinson's disease, or multiple sclerosis (MS).  Eat or drink alcohol, spicy food, caffeine, and other things that irritate the bladder.  Are overweight or obese. What are the signs or symptoms? Symptoms of this condition include a sudden, strong urge to urinate. Other symptoms include:  Leaking urine.  Urinating 8 or more times a day.  Waking up to urinate 2 or more times overnight. How is this diagnosed? This condition may be diagnosed based on:  Your symptoms and medical history.  A physical exam.  Blood or urine tests to check for possible causes,  such as infection. You may also need to see a health care provider who specializes in urinary tract problems. This is called a urologist. How is this treated? Treatment for overactive bladder depends on the cause of your condition and whether it is mild or severe. Treatment may include:  Bladder training, such as: ? Learning to control the urge to urinate by following a schedule to urinate at regular intervals. ? Doing Kegel exercises to strengthen the pelvic floor muscles that support your bladder.  Special devices, such as: ? Biofeedback. This uses sensors to help you become aware of your body's signals. ? Electrical stimulation. This uses electrodes placed inside the body (implanted) or outside the body. These electrodes send gentle pulses of electricity to strengthen the nerves or muscles that control the bladder. ? Women may use a plastic device, called a pessary, that fits into the vagina and supports the bladder.  Medicines, such as: ? Antibiotics to treat bladder infection. ? Antispasmodics to stop the bladder from releasing urine at the wrong time. ? Tricyclic antidepressants to relax bladder muscles. ? Injections of botulinum toxin type A directly into the bladder tissue to relax bladder muscles.  Surgery, such as: ? A device may be implanted to help manage the nerve signals that control urination. ? An electrode may be implanted to stimulate electrical signals in the bladder. ? A procedure may be done to change the shape of the bladder. This is done only in very severe cases. Follow these instructions at home: Eating and drinking  Make diet or lifestyle changes recommended by your health care provider. These may include: ? Drinking fluids   throughout the day and not only with meals. ? Cutting down on caffeine or alcohol. ? Eating a healthy and balanced diet to prevent constipation. This may include:  Choosing foods that are high in fiber, such as beans, whole grains, and  fresh fruits and vegetables.  Limiting foods that are high in fat and processed sugars, such as fried and sweet foods.   Lifestyle  Lose weight if needed.  Do not use any products that contain nicotine or tobacco. These include cigarettes, chewing tobacco, and vaping devices, such as e-cigarettes. If you need help quitting, ask your health care provider.   General instructions  Take over-the-counter and prescription medicines only as told by your health care provider.  If you were prescribed an antibiotic medicine, take it as told by your health care provider. Do not stop taking the antibiotic even if you start to feel better.  Use any implants or pessary as told by your health care provider.  If needed, wear pads to absorb urine leakage.  Keep a log to track how much and when you drink, and when you need to urinate. This will help your health care provider monitor your condition.  Keep all follow-up visits. This is important. Contact a health care provider if:  You have a fever or chills.  Your symptoms do not get better with treatment.  Your pain and discomfort get worse.  You have more frequent urges to urinate. Get help right away if:  You are not able to control your bladder. Summary  Overactive bladder refers to a condition in which a person has a sudden and frequent need to urinate.  Several conditions may lead to an overactive bladder.  Treatment for overactive bladder depends on the cause and severity of your condition.  Making lifestyle changes, doing Kegel exercises, keeping a log, and taking medicines can help with this condition. This information is not intended to replace advice given to you by your health care provider. Make sure you discuss any questions you have with your health care provider. Document Revised: 03/21/2020 Document Reviewed: 03/21/2020 Elsevier Patient Education  2021 [employer]  

## 2020-09-07 LAB — CBC WITH DIFFERENTIAL/PLATELET
Basophils Absolute: 0 10*3/uL (ref 0.0–0.2)
Basos: 1 %
EOS (ABSOLUTE): 0.2 10*3/uL (ref 0.0–0.4)
Eos: 3 %
Hematocrit: 39.2 % (ref 34.0–46.6)
Hemoglobin: 12.8 g/dL (ref 11.1–15.9)
Immature Grans (Abs): 0 10*3/uL (ref 0.0–0.1)
Immature Granulocytes: 0 %
Lymphocytes Absolute: 2.5 10*3/uL (ref 0.7–3.1)
Lymphs: 39 %
MCH: 31 pg (ref 26.6–33.0)
MCHC: 32.7 g/dL (ref 31.5–35.7)
MCV: 95 fL (ref 79–97)
Monocytes Absolute: 0.6 10*3/uL (ref 0.1–0.9)
Monocytes: 9 %
Neutrophils Absolute: 3 10*3/uL (ref 1.4–7.0)
Neutrophils: 48 %
Platelets: 212 10*3/uL (ref 150–450)
RBC: 4.13 x10E6/uL (ref 3.77–5.28)
RDW: 13 % (ref 11.7–15.4)
WBC: 6.3 10*3/uL (ref 3.4–10.8)

## 2020-09-07 LAB — CMP14+EGFR
ALT: 10 IU/L (ref 0–32)
AST: 18 IU/L (ref 0–40)
Albumin/Globulin Ratio: 1.2 (ref 1.2–2.2)
Albumin: 4.3 g/dL (ref 3.6–4.6)
Alkaline Phosphatase: 83 IU/L (ref 44–121)
BUN/Creatinine Ratio: 20 (ref 12–28)
BUN: 17 mg/dL (ref 8–27)
Bilirubin Total: 0.3 mg/dL (ref 0.0–1.2)
CO2: 23 mmol/L (ref 20–29)
Calcium: 9 mg/dL (ref 8.7–10.3)
Chloride: 99 mmol/L (ref 96–106)
Creatinine, Ser: 0.86 mg/dL (ref 0.57–1.00)
GFR calc Af Amer: 71 mL/min/{1.73_m2} (ref >59)
GFR calc non Af Amer: 62 mL/min/{1.73_m2} (ref >59)
Globulin, Total: 3.6 g/dL (ref 1.5–4.5)
Glucose: 88 mg/dL (ref 65–99)
Potassium: 4.5 mmol/L (ref 3.5–5.2)
Sodium: 139 mmol/L (ref 134–144)
Total Protein: 7.9 g/dL (ref 6.0–8.5)

## 2020-09-07 LAB — TSH: TSH: 4.88 u[IU]/mL — ABNORMAL HIGH (ref 0.450–4.500)

## 2020-09-07 LAB — LIPID PANEL
Chol/HDL Ratio: 3.4 ratio (ref 0.0–4.4)
Cholesterol, Total: 270 mg/dL — ABNORMAL HIGH (ref 100–199)
HDL: 79 mg/dL (ref >39)
LDL Chol Calc (NIH): 166 mg/dL — ABNORMAL HIGH (ref 0–99)
Triglycerides: 142 mg/dL (ref 0–149)
VLDL Cholesterol Cal: 25 mg/dL (ref 5–40)

## 2020-09-07 LAB — VITAMIN D 25 HYDROXY (VIT D DEFICIENCY, FRACTURES): Vit D, 25-Hydroxy: 39.6 ng/mL (ref 30.0–100.0)

## 2020-09-08 ENCOUNTER — Other Ambulatory Visit: Payer: Self-pay | Admitting: Family

## 2020-09-08 MED ORDER — LEVOTHYROXINE SODIUM 75 MCG PO TABS: 75.0000 ug | ORAL_TABLET | Freq: Every day | ORAL | 3 refills | Status: DC

## 2020-09-09 ENCOUNTER — Other Ambulatory Visit: Payer: Self-pay | Admitting: Family

## 2020-09-14 ENCOUNTER — Telehealth: Payer: Self-pay

## 2020-09-14 NOTE — Telephone Encounter (Signed)
[facility] provider....  Patient was seen by [facility] on 09/06/20.  She is needing a refill of her HCTZ 12.5 mg.  The medication is not on patient's current medication list but is in her history, and appears to have been discontinued by Alyse Low at the visit, but there is no mention of discontinuing the med in her office visit notes.  Please advise.

## 2020-09-14 NOTE — Telephone Encounter (Signed)
  Prescription Request  09/14/2020  What is the name of the medication or equipment? Pt said that her blood pressure pills have not been sent in. She said her legs are swollen up to her knees  Have you contacted your pharmacy to request a refill? (if applicable) yes  Which pharmacy would you like this sent to? [facility] in [place]   Patient notified that their request is being sent to the clinical staff for review and that they should receive a response within 2 business days.

## 2020-09-14 NOTE — Telephone Encounter (Signed)
Yes that is a tough one because looking at the notes I cannot tell either, this may have to wait until Alyse Low comes back tomorrow

## 2020-09-15 MED ORDER — HYDROCHLOROTHIAZIDE 12.5 MG PO TABS: 12.5000 mg | ORAL_TABLET | Freq: Every day | ORAL | 3 refills | Status: DC

## 2020-10-07 ENCOUNTER — Telehealth: Payer: Self-pay

## 2020-10-07 NOTE — Telephone Encounter (Signed)
Busy x 2. 

## 2020-10-13 NOTE — Telephone Encounter (Signed)
rc for nurse 

## 2020-10-13 NOTE — Telephone Encounter (Signed)
LEFT DETAILED MESSAGE FOR PATIENT.

## 2020-11-22 DIAGNOSIS — M0589 Other rheumatoid arthritis with rheumatoid factor of multiple sites: Secondary | ICD-10-CM | POA: Diagnosis not present

## 2020-11-22 DIAGNOSIS — Z79899 Other long term (current) drug therapy: Secondary | ICD-10-CM | POA: Diagnosis not present

## 2020-11-29 DIAGNOSIS — E7800 Pure hypercholesterolemia, unspecified: Secondary | ICD-10-CM | POA: Diagnosis not present

## 2020-11-29 DIAGNOSIS — I10 Essential (primary) hypertension: Secondary | ICD-10-CM | POA: Diagnosis not present

## 2020-11-29 DIAGNOSIS — N289 Disorder of kidney and ureter, unspecified: Secondary | ICD-10-CM | POA: Diagnosis not present

## 2020-11-29 DIAGNOSIS — M79643 Pain in unspecified hand: Secondary | ICD-10-CM | POA: Diagnosis not present

## 2020-11-29 DIAGNOSIS — M1990 Unspecified osteoarthritis, unspecified site: Secondary | ICD-10-CM | POA: Diagnosis not present

## 2020-11-29 DIAGNOSIS — M0589 Other rheumatoid arthritis with rheumatoid factor of multiple sites: Secondary | ICD-10-CM | POA: Diagnosis not present

## 2020-11-29 DIAGNOSIS — M5030 Other cervical disc degeneration, unspecified cervical region: Secondary | ICD-10-CM | POA: Diagnosis not present

## 2020-12-05 ENCOUNTER — Other Ambulatory Visit: Payer: Self-pay

## 2020-12-05 ENCOUNTER — Ambulatory Visit (INDEPENDENT_AMBULATORY_CARE_PROVIDER_SITE_OTHER): Payer: Medicare Other | Admitting: Family

## 2020-12-05 ENCOUNTER — Encounter: Payer: Self-pay | Admitting: Family

## 2020-12-05 VITALS — BP 136/85 | HR 102 | Temp 97.5°F | Ht <= 58 in | Wt 121.4 lb

## 2020-12-05 DIAGNOSIS — K219 Gastro-esophageal reflux disease without esophagitis: Secondary | ICD-10-CM | POA: Diagnosis not present

## 2020-12-05 DIAGNOSIS — R609 Edema, unspecified: Secondary | ICD-10-CM

## 2020-12-05 DIAGNOSIS — E785 Hyperlipidemia, unspecified: Secondary | ICD-10-CM | POA: Diagnosis not present

## 2020-12-05 DIAGNOSIS — N3281 Overactive bladder: Secondary | ICD-10-CM

## 2020-12-05 DIAGNOSIS — E039 Hypothyroidism, unspecified: Secondary | ICD-10-CM | POA: Diagnosis not present

## 2020-12-05 DIAGNOSIS — M0579 Rheumatoid arthritis with rheumatoid factor of multiple sites without organ or systems involvement: Secondary | ICD-10-CM

## 2020-12-05 DIAGNOSIS — I10 Essential (primary) hypertension: Secondary | ICD-10-CM | POA: Diagnosis not present

## 2020-12-05 DIAGNOSIS — E559 Vitamin D deficiency, unspecified: Secondary | ICD-10-CM | POA: Diagnosis not present

## 2020-12-05 NOTE — Patient Instructions (Signed)
Peripheral Edema  Peripheral edema is swelling that is caused by a buildup of fluid. Peripheral edema most often affects the lower legs, ankles, and feet. It can also develop in the arms, hands, and face. The area of the body that has peripheral edema will look swollen. It may also feel heavy or warm. Your clothes may start to feel tight. Pressing on the area may make a temporary dent in your skin. You may not be able to move your swollen arm or leg as much as usual. There are many causes of peripheral edema. It can happen because of a complication of other conditions such as congestive heart failure, kidney disease, or a problem with your blood circulation. It also can be a side effect of certain medicines or because of an infection. It often happens to women during pregnancy. Sometimes, the cause is not known. Follow these instructions at home: Managing pain, stiffness, and swelling  Raise (elevate) your legs while you are sitting or lying down.  Move around often to prevent stiffness and to lessen swelling.  Do not sit or stand for long periods of time.  Wear support stockings as told by your health care provider.   Medicines  Take over-the-counter and prescription medicines only as told by your health care provider.  Your health care provider may prescribe medicine to help your body get rid of excess water (diuretic). General instructions  Pay attention to any changes in your symptoms.  Follow instructions from your health care provider about limiting salt (sodium) in your diet. Sometimes, eating less salt may reduce swelling.  Moisturize skin daily to help prevent skin from cracking and draining.  Keep all follow-up visits as told by your health care provider. This is important. Contact a health care provider if you have:  A fever.  Edema that starts suddenly or is getting worse, especially if you are pregnant or have a medical condition.  Swelling in only one leg.  Increased  swelling, redness, or pain in one or both of your legs.  Drainage or sores at the area where you have edema. Get help right away if you:  Develop shortness of breath, especially when you are lying down.  Have pain in your chest or abdomen.  Feel weak.  Feel faint. Summary  Peripheral edema is swelling that is caused by a buildup of fluid. Peripheral edema most often affects the lower legs, ankles, and feet.  Move around often to prevent stiffness and to lessen swelling. Do not sit or stand for long periods of time.  Pay attention to any changes in your symptoms.  Contact a health care provider if you have edema that starts suddenly or is getting worse, especially if you are pregnant or have a medical condition.  Get help right away if you develop shortness of breath, especially when lying down. This information is not intended to replace advice given to you by your health care provider. Make sure you discuss any questions you have with your health care provider. Document Revised: 03/26/2018 Document Reviewed: 03/26/2018 Elsevier Patient Education  2021 [employer].

## 2020-12-05 NOTE — Progress Notes (Signed)
Subjective:    Patient ID: Ashley Nelson, female    DOB: Oct 06, 1932, 87 y.o.   MRN: [id number]  Chief Complaint  Patient presents with  . Hypertension    Pt presents to the office today for chronic follow up. Pt has rheumatoid arthritis and is followed by Rheumatologists every33month. She is currently on methotrexate.  Hypertension This is a chronic problem. The current episode started more than 1 year ago. The problem has been resolved since onset. The problem is controlled. Pertinent negatives include no malaise/fatigue, peripheral edema or shortness of breath. Risk factors for coronary artery disease include dyslipidemia and sedentary lifestyle. The current treatment provides moderate improvement. Identifiable causes of hypertension include a thyroid problem.  Gastroesophageal Reflux She complains of belching and heartburn. This is a chronic problem. The current episode started more than 1 year ago. The problem occurs occasionally. The problem has been waxing and waning. The symptoms are aggravated by certain foods. Pertinent negatives include no fatigue. She has tried a PPI for the symptoms. The treatment provided moderate relief.  Thyroid Problem Presents for follow-up visit. Symptoms include constipation. Patient reports no dry skin or fatigue. The symptoms have been stable. Her past medical history is significant for hyperlipidemia.  Arthritis Presents for follow-up visit. She complains of pain and stiffness. The symptoms have been stable. Affected locations include the left knee, right knee, right MCP and left MCP. Her pain is at a severity of 7/10. Pertinent negatives include no fatigue.  Urinary Frequency  This is a chronic problem. The current episode started more than 1 year ago. The patient is experiencing no pain. Associated symptoms include frequency. Pertinent negatives include no hematuria or urgency. She has tried increased fluids for the symptoms. The treatment provided  moderate relief.  Hyperlipidemia This is a chronic problem. The current episode started more than 1 year ago. The problem is controlled. Pertinent negatives include no shortness of breath. Current antihyperlipidemic treatment includes statins. The current treatment provides moderate improvement of lipids. Risk factors for coronary artery disease include hypertension, post-menopausal, a sedentary lifestyle and dyslipidemia.      Review of Systems  Constitutional: Negative for fatigue and malaise/fatigue.  Respiratory: Negative for shortness of breath.   Gastrointestinal: Positive for constipation and heartburn.  Genitourinary: Positive for frequency. Negative for hematuria and urgency.  Musculoskeletal: Positive for arthritis and stiffness.  All other systems reviewed and are negative.      Objective:   Physical Exam Vitals reviewed.  Constitutional:      General: She is not in acute distress.    Appearance: She is well-developed.  HENT:     Head: Normocephalic and atraumatic.     Right Ear: Tympanic membrane normal.     Left Ear: Tympanic membrane normal.  Eyes:     Pupils: Pupils are equal, round, and reactive to light.  Neck:     Thyroid: No thyromegaly.  Cardiovascular:     Rate and Rhythm: Normal rate and regular rhythm.     Heart sounds: Normal heart sounds. No murmur heard.   Pulmonary:     Effort: Pulmonary effort is normal. No respiratory distress.     Breath sounds: Normal breath sounds. No wheezing.  Abdominal:     General: Bowel sounds are normal. There is no distension.     Palpations: Abdomen is soft.     Tenderness: There is no abdominal tenderness.  Musculoskeletal:        General: No tenderness. Normal range of motion.  Cervical back: Normal range of motion and neck supple.     Right lower leg: Edema (trace) present.     Left lower leg: Edema (trace) present.  Skin:    General: Skin is warm and dry.  Neurological:     Mental Status: She is alert  and oriented to person, place, and time.     Cranial Nerves: No cranial nerve deficit.     Deep Tendon Reflexes: Reflexes are normal and symmetric.  Psychiatric:        Behavior: Behavior normal.        Thought Content: Thought content normal.        Judgment: Judgment normal.        BP 136/85   Pulse (!) 102   Temp (!) 97.5 F (36.4 C) (Temporal)   Ht _0  (1.473 m)   Wt 121 lb 6.4 oz (55.1 kg)   BMI 25.37 kg/m   Assessment & Plan:  Ashley Nelson comes in today with chief complaint of Hypertension   Diagnosis and orders addressed:  1. Essential hypertension - CMP14+EGFR  2. Gastroesophageal reflux disease, unspecified whether esophagitis present - CMP14+EGFR  3. Hypothyroidism, unspecified type - CMP14+EGFR - TSH  4. Rheumatoid arthritis involving multiple sites with positive rheumatoid factor (HCC) - CMP14+EGFR  5. Overactive bladder - CMP14+EGFR  6. Vitamin D deficiency - CMP14+EGFR  7. Hyperlipidemia, unspecified hyperlipidemia type  - CMP14+EGFR   8. Peripheral edema Low salt diet  Compress hose    Labs pending Health Maintenance reviewed Diet and exercise encouraged  Follow up plan: 6 months   Ashley Dun, FNP

## 2020-12-06 ENCOUNTER — Other Ambulatory Visit: Payer: Self-pay | Admitting: Family

## 2020-12-06 LAB — CMP14+EGFR
ALT: 11 IU/L (ref 0–32)
AST: 15 IU/L (ref 0–40)
Albumin/Globulin Ratio: 1.2 (ref 1.2–2.2)
Albumin: 4.2 g/dL (ref 3.6–4.6)
Alkaline Phosphatase: 85 IU/L (ref 44–121)
BUN/Creatinine Ratio: 23 (ref 12–28)
BUN: 16 mg/dL (ref 8–27)
Bilirubin Total: 0.4 mg/dL (ref 0.0–1.2)
CO2: 23 mmol/L (ref 20–29)
Calcium: 9.1 mg/dL (ref 8.7–10.3)
Chloride: 99 mmol/L (ref 96–106)
Creatinine, Ser: 0.71 mg/dL (ref 0.57–1.00)
Globulin, Total: 3.4 g/dL (ref 1.5–4.5)
Glucose: 85 mg/dL (ref 65–99)
Potassium: 4.1 mmol/L (ref 3.5–5.2)
Sodium: 139 mmol/L (ref 134–144)
Total Protein: 7.6 g/dL (ref 6.0–8.5)
eGFR: 83 mL/min/{1.73_m2} (ref >59)

## 2020-12-06 LAB — TSH: TSH: 0.277 u[IU]/mL — ABNORMAL LOW (ref 0.450–4.500)

## 2020-12-06 MED ORDER — LEVOTHYROXINE SODIUM 50 MCG PO TABS: 50.0000 ug | ORAL_TABLET | Freq: Every day | ORAL | 2 refills | Status: DC

## 2020-12-28 DIAGNOSIS — M1990 Unspecified osteoarthritis, unspecified site: Secondary | ICD-10-CM | POA: Diagnosis not present

## 2020-12-28 DIAGNOSIS — M5030 Other cervical disc degeneration, unspecified cervical region: Secondary | ICD-10-CM | POA: Diagnosis not present

## 2020-12-28 DIAGNOSIS — E7800 Pure hypercholesterolemia, unspecified: Secondary | ICD-10-CM | POA: Diagnosis not present

## 2020-12-28 DIAGNOSIS — M79643 Pain in unspecified hand: Secondary | ICD-10-CM | POA: Diagnosis not present

## 2020-12-28 DIAGNOSIS — M0589 Other rheumatoid arthritis with rheumatoid factor of multiple sites: Secondary | ICD-10-CM | POA: Diagnosis not present

## 2020-12-28 DIAGNOSIS — N289 Disorder of kidney and ureter, unspecified: Secondary | ICD-10-CM | POA: Diagnosis not present

## 2020-12-28 DIAGNOSIS — I10 Essential (primary) hypertension: Secondary | ICD-10-CM | POA: Diagnosis not present

## 2021-01-25 DIAGNOSIS — M79643 Pain in unspecified hand: Secondary | ICD-10-CM | POA: Diagnosis not present

## 2021-01-25 DIAGNOSIS — M47816 Spondylosis without myelopathy or radiculopathy, lumbar region: Secondary | ICD-10-CM | POA: Diagnosis not present

## 2021-01-25 DIAGNOSIS — M5030 Other cervical disc degeneration, unspecified cervical region: Secondary | ICD-10-CM | POA: Diagnosis not present

## 2021-01-25 DIAGNOSIS — M419 Scoliosis, unspecified: Secondary | ICD-10-CM | POA: Diagnosis not present

## 2021-01-25 DIAGNOSIS — E7800 Pure hypercholesterolemia, unspecified: Secondary | ICD-10-CM | POA: Diagnosis not present

## 2021-01-25 DIAGNOSIS — M1990 Unspecified osteoarthritis, unspecified site: Secondary | ICD-10-CM | POA: Diagnosis not present

## 2021-01-25 DIAGNOSIS — I10 Essential (primary) hypertension: Secondary | ICD-10-CM | POA: Diagnosis not present

## 2021-01-25 DIAGNOSIS — M0589 Other rheumatoid arthritis with rheumatoid factor of multiple sites: Secondary | ICD-10-CM | POA: Diagnosis not present

## 2021-01-25 DIAGNOSIS — M4317 Spondylolisthesis, lumbosacral region: Secondary | ICD-10-CM | POA: Diagnosis not present

## 2021-01-25 DIAGNOSIS — Q7649 Other congenital malformations of spine, not associated with scoliosis: Secondary | ICD-10-CM | POA: Diagnosis not present

## 2021-01-25 DIAGNOSIS — N289 Disorder of kidney and ureter, unspecified: Secondary | ICD-10-CM | POA: Diagnosis not present

## 2021-02-08 ENCOUNTER — Ambulatory Visit (INDEPENDENT_AMBULATORY_CARE_PROVIDER_SITE_OTHER): Payer: Medicare Other | Admitting: Family

## 2021-02-08 ENCOUNTER — Other Ambulatory Visit: Payer: Self-pay

## 2021-02-08 ENCOUNTER — Encounter: Payer: Self-pay | Admitting: Family

## 2021-02-08 VITALS — BP 166/77 | HR 78 | Temp 97.1°F | Ht <= 58 in | Wt 120.0 lb

## 2021-02-08 DIAGNOSIS — M79672 Pain in left foot: Secondary | ICD-10-CM | POA: Diagnosis not present

## 2021-02-08 DIAGNOSIS — M79671 Pain in right foot: Secondary | ICD-10-CM | POA: Diagnosis not present

## 2021-02-08 DIAGNOSIS — E039 Hypothyroidism, unspecified: Secondary | ICD-10-CM | POA: Diagnosis not present

## 2021-02-08 DIAGNOSIS — M545 Low back pain, unspecified: Secondary | ICD-10-CM | POA: Diagnosis not present

## 2021-02-08 DIAGNOSIS — M0579 Rheumatoid arthritis with rheumatoid factor of multiple sites without organ or systems involvement: Secondary | ICD-10-CM | POA: Diagnosis not present

## 2021-02-08 DIAGNOSIS — N390 Urinary tract infection, site not specified: Secondary | ICD-10-CM | POA: Diagnosis not present

## 2021-02-08 LAB — URINALYSIS, COMPLETE
Bilirubin, UA: NEGATIVE
Glucose, UA: NEGATIVE
Ketones, UA: NEGATIVE
Nitrite, UA: NEGATIVE
Protein,UA: NEGATIVE
Specific Gravity, UA: 1.02 (ref 1.005–1.030)
Urobilinogen, Ur: 0.2 mg/dL (ref 0.2–1.0)
pH, UA: 5.5 (ref 5.0–7.5)

## 2021-02-08 LAB — MICROSCOPIC EXAMINATION

## 2021-02-08 MED ORDER — PREDNISONE 10 MG (21) PO TBPK: ORAL_TABLET | ORAL | 0 refills | Status: DC

## 2021-02-08 MED ORDER — MELOXICAM 7.5 MG PO TABS
7.5000 mg | ORAL_TABLET | Freq: Every day | ORAL | 0 refills | Status: DC
Start: 2021-02-08 — End: 2021-12-01

## 2021-02-08 NOTE — Progress Notes (Signed)
Subjective:    Patient ID: Ashley Nelson, female    DOB: [birth year], 87 y.o.   MRN: [id number]  Chief Complaint  Patient presents with   Hypothyroidism   Back Pain   Foot Pain    Bil x3 weeks    PT presents to the office today to recheck thyroid. She was seen on 12/05/20 and her TSH was abnormal.   She is complaining of bilateral foot pain of 7 out 10 when she walks. States she is unable to wear several of her shoes because of pain and having trouble walking.   She has RA and is followed by Rheumatologists.  She was taken off her methotrexate and started on Arava 10 mg. She is having pain in her hands of 6 out 10. She is unable to open door knobs or jars.  Back Pain  Foot Pain This is a new problem. The current episode started more than 1 month ago. The problem occurs intermittently. Pertinent negatives include no nausea or vomiting.  Urinary Tract Infection  This is a new problem. The current episode started in the past 7 days. The patient is experiencing no pain. There has been no fever. Associated symptoms include frequency and urgency. Pertinent negatives include no hematuria, nausea or vomiting. Associated symptoms comments: Foul urine smell. She has tried increased fluids for the symptoms. The treatment provided mild relief.     Review of Systems  Gastrointestinal:  Negative for nausea and vomiting.  Genitourinary:  Positive for frequency and urgency. Negative for hematuria.  Musculoskeletal:  Positive for back pain.  All other systems reviewed and are negative.     Objective:   Physical Exam Vitals reviewed.  Constitutional:      General: She is not in acute distress.    Appearance: She is well-developed.  HENT:     Head: Normocephalic and atraumatic.     Right Ear: Tympanic membrane normal.     Left Ear: Tympanic membrane normal.  Eyes:     Pupils: Pupils are equal, round, and reactive to light.  Neck:     Thyroid: No thyromegaly.  Cardiovascular:     Rate and  Rhythm: Normal rate and regular rhythm.     Heart sounds: Normal heart sounds. No murmur heard. Pulmonary:     Effort: Pulmonary effort is normal. No respiratory distress.     Breath sounds: Normal breath sounds. No wheezing.  Abdominal:     General: Bowel sounds are normal. There is no distension.     Palpations: Abdomen is soft.     Tenderness: There is no abdominal tenderness.  Musculoskeletal:        General: Tenderness present. Normal range of motion.     Cervical back: Normal range of motion and neck supple.     Comments: Pain in bilateral ball of feet, pain in bilateral hands with flexion   Skin:    General: Skin is warm and dry.  Neurological:     Mental Status: She is alert and oriented to person, place, and time.     Cranial Nerves: No cranial nerve deficit.     Deep Tendon Reflexes: Reflexes are normal and symmetric.  Psychiatric:        Behavior: Behavior normal.        Thought Content: Thought content normal.        Judgment: Judgment normal.     BP (!) 166/77   Pulse 78   Temp (!) 97.1 F (36.2 C) (Temporal)  Ht '4\' 10"'$  (1.473 m)   Wt 120 lb (54.4 kg)   BMI 25.08 kg/m       Assessment & Plan:  Kristlyn Minnifield comes in today with chief complaint of Hypothyroidism, Back Pain, and Foot Pain (Bil x3 weeks )   Diagnosis and orders addressed:  1. Left-sided low back pain without sciatica, unspecified chronicity - Urinalysis, Complete - Urine Culture  2. Hypothyroidism, unspecified type Labs pending  - TSH  3. Rheumatoid arthritis involving multiple sites with positive rheumatoid factor [facility]) Call Rheumatologists today and tell them about increased pain and unable to perform daily activities such as opening doors or jars.  Will give prednisone short term Mobic daily, no other NSAID's.  - predniSONE (STERAPRED UNI-PAK 21 TAB) 10 MG (21) TBPK tablet; Use as directed  Dispense: 21 tablet; Refill: 0 - meloxicam (MOBIC) 7.5 MG tablet; Take 1 tablet (7.5 mg  total) by mouth daily.  Dispense: 90 tablet; Refill: 0  4. Foot pain, bilateral - predniSONE (STERAPRED UNI-PAK 21 TAB) 10 MG (21) TBPK tablet; Use as directed  Dispense: 21 tablet; Refill: 0 - meloxicam (MOBIC) 7.5 MG tablet; Take 1 tablet (7.5 mg total) by mouth daily.  Dispense: 90 tablet; Refill: 0   Labs pending Health Maintenance reviewed Diet and exercise encouraged  Follow up plan: 3 months    [name], FNP

## 2021-02-08 NOTE — Patient Instructions (Signed)
Rheumatoid Arthritis Rheumatoid arthritis (RA) is a long-term (chronic) disease that causes inflammation in your joints. RA may start slowly. It most often affects the small joints of the hands and feet. Usually, the same joints are affected on both sides of your body. Inflammation from RA can also affectother parts of your body, including your heart, eyes, or lungs. There is no cure for RA, but medicines can help your symptoms and halt or slowdown the progression of the disease. What are the causes? RA is an autoimmune disease. When you have an autoimmune disease, your body's defense system (immune system) mistakenly attacks healthy body tissues. The exact cause of RA is not known. What increases the risk? You are more likely to develop this condition if you: Are a woman. Have a family history of RA or other autoimmune diseases. Have a history of smoking. Are obese. Have been exposed to pollutants or chemicals. What are the signs or symptoms? The first symptom of this condition may be morning stiffness that lasts longer than 30 minutes. Symptoms usually start gradually. They are often worse in the morning. As RA progresses, symptoms may include: Pain, stiffness, swelling, warmth, and tenderness in joints on both sides of your body. Loss of energy. Loss of appetite. Weight loss. Low-grade fever. Dry eyes and dry mouth. Firm lumps (rheumatoid nodules) that grow beneath your skin in areas such as your forearm bones near your elbows and on your hands. Changes in the appearance of joints (deformity) and loss of joint function. Symptoms of this condition vary from person to person. Symptoms of RA often come and go. Sometimes, symptoms get worse for a period of time. These are called flares. How is this diagnosed? This condition is diagnosed based on your symptoms, medical history, and physical exam. You may have X-rays or an MRI to check for the type of joint changes that are caused by  RA. You may also have blood tests to look for: Proteins (antibodies) that your immune system may make if you have RA. These include rheumatoid factor (RF) and anti-CCP. When blood tests show these proteins, you are said to have "seropositive RA." When blood tests do not show these proteins, you may have "seronegative RA." Inflammation in your blood. A low number of red blood cells (anemia). How is this treated? The goals of treatment are to relieve pain, reduce inflammation, and slow down or stop joint damage and disability. Treatment may include: Lifestyle changes. It is important to rest as needed, eat a healthy diet, and exercise. Medicines. Your health care provider may adjust your medicines every 3 months until treatment goals are reached. Common medicines include: Pain relievers (analgesics). Corticosteroids and NSAIDs to reduce inflammation. Disease-modifying antirheumatic drugs (DMARDs) to try to slow the course of the disease. Biologic response modifiers to reduce inflammation and damage. Physical therapy and occupational therapy. Surgery, if you have severe joint damage. Joint replacement or fusing of joints may be needed. Your health care provider will work with you to identify the best treatmentoption for you based on assessment of the overall disease activity in your body. Follow these instructions at home: Activity Return to your normal activities as told by your health care provider. Ask your health care provider what activities are safe for you. Rest when you are having a flare. Start an exercise program as told by your health care provider. General instructions Keep all follow-up visits as told by your health care provider. This is important. Take over-the-counter and prescription medicines only as  told by your health care provider. Where to find more information [employer] [facility] Rhe[facility]: www.rheumatology.[place]: www.arthritis.org Contact a  health care provider if: You have a flare-up of RA symptoms. You have a fever. You have side effects from your medicines. Get help right away if: You have chest pain. You have trouble breathing. You quickly develop a hot, painful joint that is more severe than your usual joint aches. Summary Rheumatoid arthritis (RA) is a long-term (chronic) disease that causes inflammation in your joints. RA is an autoimmune disease. The goals of treatment are to relieve pain, reduce inflammation, and slow down or stop joint damage and disability. This information is not intended to replace advice given to you by your health care provider. Make sure you discuss any questions you have with your healthcare provider. Document Revised: 01/13/2019 Document Reviewed: 03/04/2018 Elsevier Patient Education  2022 [employer].

## 2021-02-09 LAB — TSH: TSH: 1.54 u[IU]/mL (ref 0.450–4.500)

## 2021-02-10 LAB — URINE CULTURE

## 2021-04-06 ENCOUNTER — Telehealth: Payer: Self-pay | Admitting: Family

## 2021-04-06 NOTE — Telephone Encounter (Signed)
Tried calling patient to schedule AWV Phone # not working

## 2021-06-01 ENCOUNTER — Encounter: Payer: Self-pay | Admitting: Family

## 2021-06-01 ENCOUNTER — Ambulatory Visit (INDEPENDENT_AMBULATORY_CARE_PROVIDER_SITE_OTHER): Payer: Medicare Other | Admitting: Family

## 2021-06-01 ENCOUNTER — Other Ambulatory Visit: Payer: Self-pay

## 2021-06-01 VITALS — BP 152/83 | HR 69 | Temp 97.0°F | Ht <= 58 in | Wt 123.0 lb

## 2021-06-01 DIAGNOSIS — N3281 Overactive bladder: Secondary | ICD-10-CM | POA: Diagnosis not present

## 2021-06-01 DIAGNOSIS — E785 Hyperlipidemia, unspecified: Secondary | ICD-10-CM

## 2021-06-01 DIAGNOSIS — I10 Essential (primary) hypertension: Secondary | ICD-10-CM

## 2021-06-01 DIAGNOSIS — M0579 Rheumatoid arthritis with rheumatoid factor of multiple sites without organ or systems involvement: Secondary | ICD-10-CM

## 2021-06-01 DIAGNOSIS — E039 Hypothyroidism, unspecified: Secondary | ICD-10-CM

## 2021-06-01 DIAGNOSIS — K219 Gastro-esophageal reflux disease without esophagitis: Secondary | ICD-10-CM | POA: Diagnosis not present

## 2021-06-01 DIAGNOSIS — E559 Vitamin D deficiency, unspecified: Secondary | ICD-10-CM

## 2021-06-01 NOTE — Patient Instructions (Signed)
Health Maintenance After Age 85 After age 85, you are at a higher risk for certain long-term diseases and infections as well as injuries from falls. Falls are a major cause of broken bones and head injuries in people who are older than age 85. Getting regular preventive care can help to keep you healthy and well. Preventive care includes getting regular testing and making lifestyle changes as recommended by your health care provider. Talk with your health care provider about: Which screenings and tests you should have. A screening is a test that checks for a disease when you have no symptoms. A diet and exercise plan that is right for you. What should I know about screenings and tests to prevent falls? Screening and testing are the best ways to find a health problem early. Early diagnosis and treatment give you the best chance of managing medical conditions that are common after age 85. Certain conditions and lifestyle choices may make you more likely to have a fall. Your health care provider may recommend: Regular vision checks. Poor vision and conditions such as cataracts can make you more likely to have a fall. If you wear glasses, make sure to get your prescription updated if your vision changes. Medicine review. Work with your health care provider to regularly review all of the medicines you are taking, including over-the-counter medicines. Ask your health care provider about any side effects that may make you more likely to have a fall. Tell your health care provider if any medicines that you take make you feel dizzy or sleepy. Strength and balance checks. Your health care provider may recommend certain tests to check your strength and balance while standing, walking, or changing positions. Foot health exam. Foot pain and numbness, as well as not wearing proper footwear, can make you more likely to have a fall. Screenings, including: Osteoporosis screening. Osteoporosis is a condition that causes  the bones to get weaker and break more easily. Blood pressure screening. Blood pressure changes and medicines to control blood pressure can make you feel dizzy. Depression screening. You may be more likely to have a fall if you have a fear of falling, feel depressed, or feel unable to do activities that you used to do. Alcohol use screening. Using too much alcohol can affect your balance and may make you more likely to have a fall. Follow these instructions at home: Lifestyle Do not drink alcohol if: Your health care provider tells you not to drink. If you drink alcohol: Limit how much you have to: 0-1 drink a day for women. 0-2 drinks a day for men. Know how much alcohol is in your drink. In the [place] one drink equals one 12 oz bottle of beer (355 mL), one 5 oz glass of wine (148 mL), or one 1 oz glass of hard liquor (44 mL). Do not use any products that contain nicotine or tobacco. These products include cigarettes, chewing tobacco, and vaping devices, such as e-cigarettes. If you need help quitting, ask your health care provider. Activity  Follow a regular exercise program to stay fit. This will help you maintain your balance. Ask your health care provider what types of exercise are appropriate for you. If you need a cane or walker, use it as recommended by your health care provider. Wear supportive shoes that have nonskid soles. Safety  Remove any tripping hazards, such as rugs, cords, and clutter. Install safety equipment such as grab bars in bathrooms and safety rails on stairs. Keep rooms and walkways   well-lit. General instructions Talk with your health care provider about your risks for falling. Tell your health care provider if: You fall. Be sure to tell your health care provider about all falls, even ones that seem minor. You feel dizzy, tiredness (fatigue), or off-balance. Take over-the-counter and prescription medicines only as told by your health care provider. These include  supplements. Eat a healthy diet and maintain a healthy weight. A healthy diet includes low-fat dairy products, low-fat (lean) meats, and fiber from whole grains, beans, and lots of fruits and vegetables. Stay current with your vaccines. Schedule regular health, dental, and eye exams. Summary Having a healthy lifestyle and getting preventive care can help to protect your health and wellness after age 85. Screening and testing are the best way to find a health problem early and help you avoid having a fall. Early diagnosis and treatment give you the best chance for managing medical conditions that are more common for people who are older than age 85. Falls are a major cause of broken bones and head injuries in people who are older than age 85. Take precautions to prevent a fall at home. Work with your health care provider to learn what changes you can make to improve your health and wellness and to prevent falls. This information is not intended to replace advice given to you by your health care provider. Make sure you discuss any questions you have with your health care provider. Document Revised: 11/21/2020 Document Reviewed: 11/21/2020 Elsevier Patient Education  2022 [employer] Inc.  

## 2021-06-01 NOTE — Progress Notes (Signed)
Subjective:    Patient ID: Ashley Nelson, female    DOB: [birth year], 87 y.o.   MRN: [id number]  Chief Complaint  Patient presents with   Medical Management of Chronic Issues   Pt presents to the office today for chronic follow up. Pt has rheumatoid arthritis and was followed by Rheumatologists every 3 months. However, states she was told she couldn't do anything else for her so she stopped.  Hypertension This is a chronic problem. The current episode started more than 1 year ago. The problem has been waxing and waning since onset. The problem is uncontrolled. Pertinent negatives include no malaise/fatigue, peripheral edema or shortness of breath. Risk factors for coronary artery disease include dyslipidemia. Past treatments include diuretics. The current treatment provides moderate improvement. Identifiable causes of hypertension include a thyroid problem.  Gastroesophageal Reflux She complains of belching and heartburn. This is a chronic problem. The current episode started more than 1 year ago. The problem occurs occasionally. Associated symptoms include fatigue. She has tried a PPI for the symptoms. The treatment provided moderate relief.  Thyroid Problem Presents for follow-up visit. Symptoms include fatigue. Patient reports no anxiety, constipation or dry skin. The symptoms have been stable. Her past medical history is significant for hyperlipidemia.  Arthritis Presents for follow-up visit. She complains of pain and stiffness. The symptoms have been stable. Affected locations include the right MCP and left MCP. Her pain is at a severity of 8/10. Associated symptoms include fatigue.  Urinary Frequency  This is a chronic problem. The current episode started more than 1 year ago. The problem occurs intermittently. The problem has been waxing and waning. The pain is at a severity of 0/10. The patient is experiencing no pain. Associated symptoms include frequency.  Hyperlipidemia This is a  chronic problem. The current episode started more than 1 year ago. Pertinent negatives include no shortness of breath. Current antihyperlipidemic treatment includes diet change. The current treatment provides no improvement of lipids. Risk factors for coronary artery disease include dyslipidemia, hypertension, a sedentary lifestyle and post-menopausal.     Review of Systems  Constitutional:  Positive for fatigue. Negative for malaise/fatigue.  Respiratory:  Negative for shortness of breath.   Gastrointestinal:  Positive for heartburn. Negative for constipation.  Genitourinary:  Positive for frequency.  Musculoskeletal:  Positive for arthritis and stiffness.  Psychiatric/Behavioral:  The patient is not nervous/anxious.   All other systems reviewed and are negative.     Objective:   Physical Exam Vitals reviewed.  Constitutional:      General: She is not in acute distress.    Appearance: She is well-developed.  HENT:     Head: Normocephalic and atraumatic.     Right Ear: Tympanic membrane normal.     Left Ear: Tympanic membrane normal.  Eyes:     Pupils: Pupils are equal, round, and reactive to light.  Neck:     Thyroid: No thyromegaly.  Cardiovascular:     Rate and Rhythm: Normal rate and regular rhythm.     Heart sounds: Normal heart sounds. No murmur heard. Pulmonary:     Effort: Pulmonary effort is normal. No respiratory distress.     Breath sounds: Normal breath sounds. No wheezing.  Abdominal:     General: Bowel sounds are normal. There is no distension.     Palpations: Abdomen is soft.     Tenderness: There is no abdominal tenderness.  Musculoskeletal:        General: No tenderness. Normal range of motion.  Cervical back: Normal range of motion and neck supple.  Skin:    General: Skin is warm and dry.  Neurological:     Mental Status: She is alert and oriented to person, place, and time.     Cranial Nerves: No cranial nerve deficit.     Deep Tendon Reflexes:  Reflexes are normal and symmetric.  Psychiatric:        Behavior: Behavior normal.        Thought Content: Thought content normal.        Judgment: Judgment normal.         BP (!) 158/76   Pulse 61   Temp (!) 97 F (36.1 C) (Temporal)   Ht 4' 10"  (1.473 m)   Wt 123 lb (55.8 kg)   BMI 25.71 kg/m   Assessment & Plan:  [name] comes in today with chief complaint of Medical Management of Chronic Issues   Diagnosis and orders addressed:  1. Essential hypertension - CMP14+EGFR - CBC with Differential/Platelet  2. Gastroesophageal reflux disease, unspecified whether esophagitis present - CMP14+EGFR - CBC with Differential/Platelet  3. Hypothyroidism, unspecified type - CMP14+EGFR - CBC with Differential/Platelet - TSH  4. Rheumatoid arthritis involving multiple sites with positive rheumatoid factor (HCC)  - CMP14+EGFR - CBC with Differential/Platelet  5. Hyperlipidemia, unspecified hyperlipidemia type - CMP14+EGFR - CBC with Differential/Platelet  6. Vitamin D deficiency - CMP14+EGFR - CBC with Differential/Platelet  7. Overactive bladder - CMP14+EGFR - CBC with Differential/Platelet   Labs pending Health Maintenance reviewed Diet and exercise encouraged  Follow up plan: 6 months   [name], FNP

## 2021-06-02 LAB — CBC WITH DIFFERENTIAL/PLATELET
Basophils Absolute: 0.1 10*3/uL (ref 0.0–0.2)
Basos: 1 %
EOS (ABSOLUTE): 0.2 10*3/uL (ref 0.0–0.4)
Eos: 3 %
Hematocrit: 35.6 % (ref 34.0–46.6)
Hemoglobin: 11.9 g/dL (ref 11.1–15.9)
Immature Grans (Abs): 0 10*3/uL (ref 0.0–0.1)
Immature Granulocytes: 0 %
Lymphocytes Absolute: 1.8 10*3/uL (ref 0.7–3.1)
Lymphs: 24 %
MCH: 31.6 pg (ref 26.6–33.0)
MCHC: 33.4 g/dL (ref 31.5–35.7)
MCV: 94 fL (ref 79–97)
Monocytes Absolute: 0.5 10*3/uL (ref 0.1–0.9)
Monocytes: 7 %
Neutrophils Absolute: 4.7 10*3/uL (ref 1.4–7.0)
Neutrophils: 65 %
Platelets: 246 10*3/uL (ref 150–450)
RBC: 3.77 x10E6/uL (ref 3.77–5.28)
RDW: 12.9 % (ref 11.7–15.4)
WBC: 7.3 10*3/uL (ref 3.4–10.8)

## 2021-06-02 LAB — CMP14+EGFR
ALT: 8 IU/L (ref 0–32)
AST: 20 IU/L (ref 0–40)
Albumin/Globulin Ratio: 1.3 (ref 1.2–2.2)
Albumin: 4.3 g/dL (ref 3.6–4.6)
Alkaline Phosphatase: 101 IU/L (ref 44–121)
BUN/Creatinine Ratio: 13 (ref 12–28)
BUN: 11 mg/dL (ref 8–27)
Bilirubin Total: 0.4 mg/dL (ref 0.0–1.2)
CO2: 26 mmol/L (ref 20–29)
Calcium: 9.1 mg/dL (ref 8.7–10.3)
Chloride: 101 mmol/L (ref 96–106)
Creatinine, Ser: 0.85 mg/dL (ref 0.57–1.00)
Globulin, Total: 3.4 g/dL (ref 1.5–4.5)
Glucose: 88 mg/dL (ref 70–99)
Potassium: 4 mmol/L (ref 3.5–5.2)
Sodium: 141 mmol/L (ref 134–144)
Total Protein: 7.7 g/dL (ref 6.0–8.5)
eGFR: 67 mL/min/{1.73_m2} (ref >59)

## 2021-06-02 LAB — TSH: TSH: 18.6 u[IU]/mL — ABNORMAL HIGH (ref 0.450–4.500)

## 2021-06-20 ENCOUNTER — Encounter: Payer: Self-pay | Admitting: Family Medicine

## 2021-06-22 ENCOUNTER — Telehealth: Payer: Self-pay | Admitting: *Deleted

## 2021-06-22 NOTE — Telephone Encounter (Signed)
Pt called in to get lab results from visit on 06/01/21 w/ [facility]. Gave her results and asked her if she had been taking her levothyroxine 50 mcg. She had gone out of town for awhile and did not have it with her. Instructed her to start back on her dose and recheck in 2 months.

## 2021-08-10 ENCOUNTER — Encounter: Payer: Self-pay | Admitting: Family Medicine

## 2021-08-10 ENCOUNTER — Ambulatory Visit (INDEPENDENT_AMBULATORY_CARE_PROVIDER_SITE_OTHER): Payer: Medicare Other | Admitting: Family Medicine

## 2021-08-10 VITALS — BP 151/87 | HR 89 | Ht <= 58 in | Wt 119.0 lb

## 2021-08-10 DIAGNOSIS — R051 Acute cough: Secondary | ICD-10-CM | POA: Diagnosis not present

## 2021-08-10 DIAGNOSIS — R5383 Other fatigue: Secondary | ICD-10-CM

## 2021-08-10 MED ORDER — CEFDINIR 300 MG PO CAPS: 300.0000 mg | ORAL_CAPSULE | Freq: Two times a day (BID) | ORAL | 0 refills | Status: DC

## 2021-08-10 NOTE — Progress Notes (Signed)
BP (!) 151/87    Pulse 89    Ht 4\' 10"  (1.473 m)    Wt 119 lb (54 kg)    SpO2 95%    BMI 24.87 kg/m    Subjective:   Patient ID: Ashley Nelson, female    DOB: 05/11/1933, 88 y.o.   MRN: [id number]  HPI: Anderson Coppock is a 88 y.o. female presenting on 08/10/2021 for Cough Elam Dutch) and Fatigue   HPI Patient is coming today with cough and congestion and feels like things just are not improving.  She has been doing this for about a week.  She feels like she is getting fatigue and she is coughing up things more productive.  She did use Coricidin and it did not seem to improve things that much.  It did help a little bit at first.  She says her daughter had been sick with COVID and flu over the past month.  Relevant past medical, surgical, family and social history reviewed and updated as indicated. Interim medical history since our last visit reviewed. Allergies and medications reviewed and updated.  Review of Systems  Constitutional:  Negative for chills and fever.  HENT:  Positive for congestion and postnasal drip. Negative for ear discharge, ear pain, rhinorrhea, sinus pressure, sneezing and sore throat.   Eyes:  Negative for pain, redness and visual disturbance.  Respiratory:  Positive for cough. Negative for chest tightness and shortness of breath.   Cardiovascular:  Negative for chest pain and leg swelling.  Genitourinary:  Negative for difficulty urinating and dysuria.  Musculoskeletal:  Negative for back pain and gait problem.  Skin:  Negative for rash.  Neurological:  Negative for light-headedness and headaches.  Psychiatric/Behavioral:  Negative for agitation and behavioral problems.   All other systems reviewed and are negative.  Per HPI unless specifically indicated above   Allergies as of 08/10/2021       Reactions   Plasticized Base [plastibase]         Medication List        Accurate as of August 10, 2021  5:36 PM. If you have any questions, ask your nurse or  doctor.          atorvastatin 20 MG tablet Commonly known as: Lipitor Take 1 tablet (20 mg total) by mouth daily.   cefdinir 300 MG capsule Commonly known as: OMNICEF Take 1 capsule (300 mg total) by mouth 2 (two) times daily. 1 po BID Started by: Fransisca Kaufmann Maevyn Riordan, MD   hydrochlorothiazide 12.5 MG tablet Commonly known as: HYDRODIURIL Take 1 tablet (12.5 mg total) by mouth daily.   leflunomide 10 MG tablet Commonly known as: ARAVA   levothyroxine 50 MCG tablet Commonly known as: Synthroid Take 1 tablet (50 mcg total) by mouth daily.   meloxicam 7.5 MG tablet Commonly known as: MOBIC Take 1 tablet (7.5 mg total) by mouth daily.   mirabegron ER 25 MG Tb24 tablet Commonly known as: Myrbetriq Take 1 tablet (25 mg total) by mouth daily.         Objective:   BP (!) 151/87    Pulse 89    Ht 4\' 10"  (1.473 m)    Wt 119 lb (54 kg)    SpO2 95%    BMI 24.87 kg/m   Wt Readings from Last 3 Encounters:  08/10/21 119 lb (54 kg)  06/01/21 123 lb (55.8 kg)  02/08/21 120 lb (54.4 kg)    Physical Exam Vitals reviewed.  Constitutional:  General: She is not in acute distress.    Appearance: She is well-developed. She is not diaphoretic.  HENT:     Right Ear: Tympanic membrane, ear canal and external ear normal.     Left Ear: Tympanic membrane, ear canal and external ear normal.     Nose: Mucosal edema present. No rhinorrhea.     Right Sinus: No maxillary sinus tenderness or frontal sinus tenderness.     Left Sinus: No maxillary sinus tenderness or frontal sinus tenderness.     Mouth/Throat:     Pharynx: Uvula midline. No oropharyngeal exudate or posterior oropharyngeal erythema.     Tonsils: No tonsillar abscesses.  Eyes:     Conjunctiva/sclera: Conjunctivae normal.  Cardiovascular:     Rate and Rhythm: Normal rate and regular rhythm.     Heart sounds: Normal heart sounds. No murmur heard. Pulmonary:     Effort: Pulmonary effort is normal. No respiratory distress.      Breath sounds: Wheezing and rhonchi present. No rales.  Musculoskeletal:        General: No tenderness. Normal range of motion.  Skin:    General: Skin is warm and dry.     Findings: No rash.  Neurological:     Mental Status: She is alert and oriented to person, place, and time.     Coordination: Coordination normal.  Psychiatric:        Behavior: Behavior normal.      Assessment & Plan:   Problem List Items Addressed This Visit   None Visit Diagnoses     Acute cough    -  Primary   Relevant Medications   cefdinir (OMNICEF) 300 MG capsule   Other Relevant Orders   Veritor Flu A/B Waived   Novel Coronavirus, NAA ([facility])   Other fatigue       Relevant Medications   cefdinir (OMNICEF) 300 MG capsule   Other Relevant Orders   Veritor Flu A/B Waived   Novel Coronavirus, NAA ([facility])       Patient's lungs sounded pretty rough so we will send the cefdinir, its been going on for a week.  Recommended Mucinex as well. Follow up plan: Return if symptoms worsen or fail to improve.  Counseling provided for all of the vaccine components Orders Placed This Encounter  Procedures   Novel Coronavirus, NAA ([facility])   Veritor Flu A/B [place] Shiro Ellerman, MD [facility] 08/10/2021, 5:36 PM

## 2021-08-11 LAB — NOVEL CORONAVIRUS, NAA: SARS-CoV-2, NAA: NOT DETECTED

## 2021-08-11 LAB — SARS-COV-2, NAA 2 DAY TAT

## 2021-08-11 LAB — VERITOR FLU A/B WAIVED
Influenza A: NEGATIVE
Influenza B: NEGATIVE

## 2021-09-29 ENCOUNTER — Encounter: Payer: Self-pay | Admitting: *Deleted

## 2021-11-13 ENCOUNTER — Ambulatory Visit (INDEPENDENT_AMBULATORY_CARE_PROVIDER_SITE_OTHER): Payer: Medicare Other | Admitting: Nurse Practitioner

## 2021-11-13 ENCOUNTER — Encounter: Payer: Self-pay | Admitting: Nurse Practitioner

## 2021-11-13 VITALS — BP 151/89 | HR 99 | Temp 98.2°F | Resp 18 | Ht <= 58 in | Wt 115.4 lb

## 2021-11-13 DIAGNOSIS — R609 Edema, unspecified: Secondary | ICD-10-CM | POA: Diagnosis not present

## 2021-11-13 DIAGNOSIS — R600 Localized edema: Secondary | ICD-10-CM | POA: Insufficient documentation

## 2021-11-13 DIAGNOSIS — I10 Essential (primary) hypertension: Secondary | ICD-10-CM | POA: Diagnosis not present

## 2021-11-13 NOTE — Assessment & Plan Note (Signed)
Blood pressure not well controlled.  Patient has not taking blood pressure medication in a day.  Advised patient to take medication as prescribed, elevate feet and wear compression socks.  Follow-up with unresolved symptoms. ?

## 2021-11-13 NOTE — Progress Notes (Signed)
? ? ?Acute Office Visit ? ?Subjective:  ? ?  ?Patient ID: Ashley Nelson, female    DOB: 10-27-1932, 88 y.o.   MRN: [id number] ? ?Chief Complaint  ?Patient presents with  ? Edema  ?  Hands and feet , states she has a lot of swelling , been going on for awhile  ? Neck Pain  ? Torticollis  ?  States it hurts to turn her head side to side , this has been going on for 2 weeks now  ? ? ?Neck Pain  ?This is a new problem. The current episode started yesterday. The problem occurs intermittently. The problem has been unchanged. The pain is associated with nothing. The quality of the pain is described as aching. The pain is mild. The pain is Same all the time. Pertinent negatives include no headaches, numbness or tingling. She has tried nothing for the symptoms.  ? ? ?Edema: Patient complains of edema. The location of the edema is feet bilateral.  The edema has been mild.  Onset of symptoms was a few days ago, unchanged since that time. The edema is present all day. The patient states the problem is long-standing.  The swelling has been aggravated by  patient not compliant with current medication hydrochlorothiazide 12.5 mg tablet by mouth daily. , relieved by nothing, and been associated with nothing. Cardiac risk factors include advanced age (older than 83 for men, 16 for women) and hypertension.  ? ?Review of Systems  ?Constitutional: Negative.   ?HENT: Negative.    ?Respiratory: Negative.    ?Cardiovascular:  Positive for leg swelling.  ?Musculoskeletal:  Positive for neck pain.  ?Skin: Negative.   ?Neurological: Negative.  Negative for tingling, numbness and headaches.  ?All other systems reviewed and are negative. ? ? ?   ?Objective:  ?  ?BP (!) 151/89 (BP Location: Left Arm, Patient Position: Sitting, Cuff Size: Small)   Pulse 99   Temp 98.2 ?F (36.8 ?C) (Temporal)   Resp 18   Ht '4\' 10"'$  (1.473 m)   Wt 115 lb 6.4 oz (52.3 kg)   SpO2 95%   BMI 24.12 kg/m?  ?BP Readings from Last 3 Encounters:  ?11/13/21 (!) 151/89   ?08/10/21 (!) 151/87  ?06/01/21 (!) 152/83  ? ?Wt Readings from Last 3 Encounters:  ?11/13/21 115 lb 6.4 oz (52.3 kg)  ?08/10/21 119 lb (54 kg)  ?06/01/21 123 lb (55.8 kg)  ? ?  ? ?Physical Exam ?Vitals and nursing note reviewed.  ?Constitutional:   ?   Appearance: Normal appearance.  ?HENT:  ?   Head: Normocephalic.  ?   Right Ear: External ear normal.  ?   Left Ear: External ear normal.  ?   Nose: Nose normal.  ?Eyes:  ?   Conjunctiva/sclera: Conjunctivae normal.  ?Cardiovascular:  ?   Rate and Rhythm: Normal rate and regular rhythm.  ?   Pulses: Normal pulses.  ?   Heart sounds: Normal heart sounds.  ?Pulmonary:  ?   Effort: Pulmonary effort is normal.  ?   Breath sounds: Normal breath sounds.  ?Abdominal:  ?   General: Bowel sounds are normal.  ?Musculoskeletal:  ?   Right lower leg: Edema present.  ?   Left lower leg: Edema present.  ?Skin: ?   General: Skin is warm.  ?   Findings: No rash.  ?Neurological:  ?   Mental Status: She is alert and oriented to person, place, and time.  ?Psychiatric:     ?  Behavior: Behavior normal.  ? ? ?No results found for any visits on 11/13/21. ? ? ?   ?Assessment & Plan:  ? ?Problem List Items Addressed This Visit   ? ?  ? Cardiovascular and Mediastinum  ? Essential hypertension  ?  Blood pressure not well controlled.  Patient has not taking blood pressure medication in a day.  Advised patient to take medication as prescribed, elevate feet and wear compression socks.  Follow-up with unresolved symptoms. ? ?  ?  ?  ? Other  ? Peripheral edema - Primary  ?  Ongoing bilateral lower extremity peripheral edema.  Symptoms not well controlled.  Patient has not taken medication hydrochlorothiazide 12.5 mg tablet by mouth daily in the past day or 2.  Patient is staying with daughter and I provided education that her medication should be taken as prescribed.  Recommended bilateral compression socks and elevation. ? ?  ?  ? ? ?No orders of the defined types were placed in this  encounter. ? ? ?Return if symptoms worsen or fail to improve. ? ?Ivy Lynn, NP ? ? ?

## 2021-11-13 NOTE — Assessment & Plan Note (Signed)
Ongoing bilateral lower extremity peripheral edema.  Symptoms not well controlled.  Patient has not taken medication hydrochlorothiazide 12.5 mg tablet by mouth daily in the past day or 2.  Patient is staying with daughter and I provided education that her medication should be taken as prescribed.  Recommended bilateral compression socks and elevation. ?

## 2021-11-13 NOTE — Patient Instructions (Signed)
Edema ? ?Edema is when you have too much fluid in your body or under your skin. Edema may make your legs, feet, and ankles swell. Swelling often happens in looser tissues, such as around your eyes. This is a common condition. It gets more common as you get older. ?There are many possible causes of edema. These include: ?Eating too much salt (sodium). ?Being on your feet or sitting for a long time. ?Certain medical conditions, such as: ?Pregnancy. ?Heart failure. ?Liver disease. ?Kidney disease. ?Cancer. ?Hot weather may make edema worse. Edema is usually painless. Your skin may look swollen or shiny. ?Follow these instructions at home: ?Medicines ?Take over-the-counter and prescription medicines only as told by your doctor. ?Your doctor may prescribe a medicine to help your body get rid of extra water (diuretic). Take this medicine if you are told to take it. ?Eating and drinking ?Eat a low-salt (low-sodium) diet as told by your doctor. Sometimes, eating less salt may reduce swelling. ?Depending on the cause of your swelling, you may need to limit how much fluid you drink (fluid restriction). ?General instructions ?Raise the injured area above the level of your heart while you are sitting or lying down. ?Do not sit still or stand for a long time. ?Do not wear tight clothes. Do not wear garters on your upper legs. ?Exercise your legs. This can help the swelling go down. ?Wear compression stockings as told by your doctor. It is important that these are the right size. These should be prescribed by your doctor to prevent possible injuries. ?If elastic bandages or wraps are recommended, use them as told by your doctor. ?Contact a doctor if: ?Treatment is not working. ?You have heart, liver, or kidney disease and have symptoms of edema. ?You have sudden and unexplained weight gain. ?Get help right away if: ?You have shortness of breath or chest pain. ?You cannot breathe when you lie down. ?You have pain, redness, or  warmth in the swollen areas. ?You have heart, liver, or kidney disease and get edema all of a sudden. ?You have a fever and your symptoms get worse all of a sudden. ?These symptoms may be an emergency. Get help right away. Call 911. ?Do not wait to see if the symptoms will go away. ?Do not drive yourself to the hospital. ?Summary ?Edema is when you have too much fluid in your body or under your skin. ?Edema may make your legs, feet, and ankles swell. Swelling often happens in looser tissues, such as around your eyes. ?Raise the injured area above the level of your heart while you are sitting or lying down. ?Follow your doctor's instructions about diet and how much fluid you can drink. ?This information is not intended to replace advice given to you by your health care provider. Make sure you discuss any questions you have with your health care provider. ?Document Revised: 03/06/2021 Document Reviewed: 03/06/2021 ?Elsevier Patient Education ? 2023 [employer] ? ?

## 2021-11-30 ENCOUNTER — Ambulatory Visit: Payer: Medicare Other | Admitting: Family

## 2021-12-01 ENCOUNTER — Ambulatory Visit (INDEPENDENT_AMBULATORY_CARE_PROVIDER_SITE_OTHER): Payer: Medicare Other | Admitting: Family

## 2021-12-01 ENCOUNTER — Encounter: Payer: Self-pay | Admitting: Family

## 2021-12-01 VITALS — BP 165/72 | HR 69 | Temp 97.1°F | Ht <= 58 in | Wt 115.8 lb

## 2021-12-01 DIAGNOSIS — M79672 Pain in left foot: Secondary | ICD-10-CM

## 2021-12-01 DIAGNOSIS — M810 Age-related osteoporosis without current pathological fracture: Secondary | ICD-10-CM | POA: Diagnosis not present

## 2021-12-01 DIAGNOSIS — E559 Vitamin D deficiency, unspecified: Secondary | ICD-10-CM | POA: Diagnosis not present

## 2021-12-01 DIAGNOSIS — N3281 Overactive bladder: Secondary | ICD-10-CM

## 2021-12-01 DIAGNOSIS — I10 Essential (primary) hypertension: Secondary | ICD-10-CM | POA: Diagnosis not present

## 2021-12-01 DIAGNOSIS — M0579 Rheumatoid arthritis with rheumatoid factor of multiple sites without organ or systems involvement: Secondary | ICD-10-CM

## 2021-12-01 DIAGNOSIS — K219 Gastro-esophageal reflux disease without esophagitis: Secondary | ICD-10-CM | POA: Diagnosis not present

## 2021-12-01 DIAGNOSIS — E785 Hyperlipidemia, unspecified: Secondary | ICD-10-CM

## 2021-12-01 DIAGNOSIS — E039 Hypothyroidism, unspecified: Secondary | ICD-10-CM | POA: Diagnosis not present

## 2021-12-01 DIAGNOSIS — M79671 Pain in right foot: Secondary | ICD-10-CM | POA: Diagnosis not present

## 2021-12-01 MED ORDER — ATORVASTATIN CALCIUM 20 MG PO TABS: 20.0000 mg | ORAL_TABLET | Freq: Every day | ORAL | 3 refills | Status: DC

## 2021-12-01 MED ORDER — MIRABEGRON ER 25 MG PO TB24: 25.0000 mg | ORAL_TABLET | Freq: Every day | ORAL | 3 refills | Status: DC

## 2021-12-01 MED ORDER — HYDROCHLOROTHIAZIDE 12.5 MG PO TABS: 12.5000 mg | ORAL_TABLET | Freq: Every day | ORAL | 3 refills | Status: DC

## 2021-12-01 MED ORDER — MELOXICAM 7.5 MG PO TABS: 7.5000 mg | ORAL_TABLET | Freq: Every day | ORAL | 2 refills | Status: DC

## 2021-12-01 MED ORDER — LEVOTHYROXINE SODIUM 50 MCG PO TABS: 50.0000 ug | ORAL_TABLET | Freq: Every day | ORAL | 3 refills | Status: DC

## 2021-12-01 NOTE — Progress Notes (Signed)
Subjective:    Patient ID: Ashley Nelson, female    DOB: 02/18/1933, 88 y.o.   MRN: [id number]  Chief Complaint  Patient presents with   Medical Management of Chronic Issues   Pt presents to the office today for chronic follow up. Pt has rheumatoid arthritis and was followed by Rheumatologists every 3 months. However, states she was told she couldn't do anything else for her so she stopped. Since then her pain has significantly increased. She has moved in with her daughter because of the pain. Daughter has been out of work caring for her because she can not cook or drive.  Hypertension This is a chronic problem. The current episode started more than 1 year ago. The problem has been waxing and waning since onset. The problem is uncontrolled. Associated symptoms include malaise/fatigue and peripheral edema. Pertinent negatives include no shortness of breath. Risk factors for coronary artery disease include dyslipidemia and sedentary lifestyle. The current treatment provides moderate improvement. Identifiable causes of hypertension include a thyroid problem.  Gastroesophageal Reflux She complains of belching and heartburn. She reports no hoarse voice. This is a chronic problem. The current episode started more than 1 year ago. The problem occurs rarely. The problem has been resolved. The symptoms are aggravated by certain foods. Associated symptoms include fatigue. She has tried a diet change for the symptoms. The treatment provided moderate relief.  Thyroid Problem Presents for follow-up visit. Symptoms include constipation and fatigue. Patient reports no anxiety or hoarse voice. The symptoms have been stable. Her past medical history is significant for hyperlipidemia.  Arthritis Presents for follow-up visit. She complains of pain and stiffness. The symptoms have been stable. Affected locations include the left MCP, right MCP, left knee and right knee. Her pain is at a severity of 10/10. Associated  symptoms include fatigue.  Urinary Frequency  This is a chronic problem. The current episode started more than 1 year ago. Associated symptoms include frequency and urgency. She has tried nothing for the symptoms. The treatment provided moderate relief.  Hyperlipidemia This is a chronic problem. The current episode started more than 1 year ago. The problem is controlled. Exacerbating diseases include hypothyroidism. Pertinent negatives include no shortness of breath. Current antihyperlipidemic treatment includes statins. The current treatment provides moderate improvement of lipids. Risk factors for coronary artery disease include dyslipidemia, hypertension and a sedentary lifestyle.     Review of Systems  Constitutional:  Positive for fatigue and malaise/fatigue.  HENT:  Negative for hoarse voice.   Respiratory:  Negative for shortness of breath.   Gastrointestinal:  Positive for constipation and heartburn.  Genitourinary:  Positive for frequency and urgency.  Musculoskeletal:  Positive for arthritis and stiffness.  Psychiatric/Behavioral:  The patient is not nervous/anxious.   All other systems reviewed and are negative.     Objective:   Physical Exam Vitals reviewed.  Constitutional:      General: She is not in acute distress.    Appearance: She is well-developed.  HENT:     Head: Normocephalic and atraumatic.     Right Ear: Tympanic membrane normal.     Left Ear: Tympanic membrane normal.  Eyes:     Pupils: Pupils are equal, round, and reactive to light.  Neck:     Thyroid: No thyromegaly.  Cardiovascular:     Rate and Rhythm: Normal rate and regular rhythm.     Heart sounds: Normal heart sounds. No murmur heard. Pulmonary:     Effort: Pulmonary effort is normal. No  respiratory distress.     Breath sounds: Normal breath sounds. No wheezing.  Abdominal:     General: Bowel sounds are normal. There is no distension.     Palpations: Abdomen is soft.     Tenderness: There is  no abdominal tenderness.  Musculoskeletal:        General: Tenderness present. Normal range of motion.     Cervical back: Normal range of motion and neck supple.     Comments: Swelling in bilateral ankles and hands  Skin:    General: Skin is warm and dry.  Neurological:     Mental Status: She is alert and oriented to person, place, and time.     Cranial Nerves: No cranial nerve deficit.     Deep Tendon Reflexes: Reflexes are normal and symmetric.  Psychiatric:        Behavior: Behavior normal.        Thought Content: Thought content normal.        Judgment: Judgment normal.      BP (!) 165/72   Pulse 69   Temp (!) 97.1 F (36.2 C)   Ht 4' 10"  (1.473 m)   Wt 115 lb 12.8 oz (52.5 kg)   SpO2 95%   BMI 24.20 kg/m      Assessment & Plan:  Ashley Nelson comes in today with chief complaint of Medical Management of Chronic Issues   Diagnosis and orders addressed:  1. Overactive bladder - mirabegron ER (MYRBETRIQ) 25 MG TB24 tablet; Take 1 tablet (25 mg total) by mouth daily.  Dispense: 90 tablet; Refill: 3 - CMP14+EGFR - CBC with Differential/Platelet  2. Essential hypertension - hydrochlorothiazide (HYDRODIURIL) 12.5 MG tablet; Take 1 tablet (12.5 mg total) by mouth daily.  Dispense: 90 tablet; Refill: 3 - CMP14+EGFR - CBC with Differential/Platelet  3. Gastroesophageal reflux disease, unspecified whether esophagitis present - CMP14+EGFR - CBC with Differential/Platelet  4. Hypothyroidism, unspecified type - levothyroxine (SYNTHROID) 50 MCG tablet; Take 1 tablet (50 mcg total) by mouth daily.  Dispense: 90 tablet; Refill: 3 - CMP14+EGFR - CBC with Differential/Platelet - TSH  5. Rheumatoid arthritis involving multiple sites with positive rheumatoid factor ([employer]) Pt referred to new Rheumatologists given pain and swelling.  Will complete FMLA paperwork for daughter. Daughter states she has been out of work from 11/13/21 til  12/04/21. - meloxicam (MOBIC) 7.5 MG tablet;  Take 1 tablet (7.5 mg total) by mouth daily.  Dispense: 90 tablet; Refill: 2 - CMP14+EGFR - CBC with Differential/Platelet - Ambulatory referral to Rheumatology  6. Osteoporosis, unspecified osteoporosis type, unspecified pathological fracture presence - CMP14+EGFR - CBC with Differential/Platelet  7. Hyperlipidemia, unspecified hyperlipidemia type - atorvastatin (LIPITOR) 20 MG tablet; Take 1 tablet (20 mg total) by mouth daily.  Dispense: 90 tablet; Refill: 3 - CMP14+EGFR - CBC with Differential/Platelet  8. Vitamin D deficiency - CMP14+EGFR - CBC with Differential/Platelet  9. Foot pain, bilateral - meloxicam (MOBIC) 7.5 MG tablet; Take 1 tablet (7.5 mg total) by mouth daily.  Dispense: 90 tablet; Refill: 2 - CMP14+EGFR - CBC with Differential/Platelet   Labs pending Health Maintenance reviewed Diet and exercise encouraged  Follow up plan: 3 months   [name], FNP

## 2021-12-01 NOTE — Patient Instructions (Addendum)
Rheumatoid Arthritis Rheumatoid arthritis (RA) is a long-term (chronic) disease that causes inflammation in the joints. RA may start slowly. It most often affects the small joints of the hands and feet. Usually, the same joints are affected on both sides of the body. Inflammation from RA can also affect other parts of the body, including the heart, eyes, or lungs. There is no cure for RA, but medicines can help your symptoms and stop or slow down the progression of the disease. What are the causes? RA is an autoimmune disease. When you have an autoimmune disease, your body's defense system (immune system) mistakenly attacks healthy body tissues. The exact cause of RA is not known. What increases the risk? The following factors may make you more likely to develop this condition: Being female. Having a family history of RA or other autoimmune diseases. Having a history of smoking. Being obese. Having been exposed to pollutants or chemicals. What are the signs or symptoms? Symptoms of this condition usually start gradually. They are often worse in the morning. The first symptom may be morning stiffness that lasts longer than 30 minutes. As RA progresses, symptoms may include: Pain, stiffness, swelling, warmth, and tenderness in joints on both sides of your body. Loss of energy. Loss of appetite. Weight loss. Low-grade fever. Dry eyes and dry mouth. Firm lumps (rheumatoid nodules) that grow beneath your skin in areas such as your forearm bones near your elbows and on your hands. Changes in the appearance of joints (deformity) and loss of joint function. Symptoms of this condition vary from person to person. Symptoms of RA often come and go. Sometimes, symptoms get worse for a period of time. These are called flares. How is this diagnosed? This condition is diagnosed based on your symptoms, medical history, and a physical exam. You may have X-rays or an MRI to check for the type of joint  changes that are caused by RA. You may also have blood tests to look for: Proteins (antibodies) that your immune system may make if you have RA. These include rheumatoid factor (RF) and anti-CCP. When blood tests show these proteins, you are said to have "seropositive RA." When blood tests do not show these proteins, you may have "seronegative RA." Inflammation in your blood. A low number of red blood cells (anemia). How is this treated? The goals of treatment are to relieve pain, reduce inflammation, and slow down or stop joint damage and disability. Treatment may include: Lifestyle changes. It is important to rest as needed, eat a healthy diet, and exercise. Medicines. Your health care provider may adjust your medicines every 3 months until treatment goals are reached. Common medicines include: Pain relievers (analgesics). Corticosteroids and NSAIDs, such as ibuprofen, to reduce inflammation. Disease-modifying antirheumatic drugs (DMARDs) to try to slow the course of the disease. Biologic response modifiers to reduce inflammation and damage. Physical therapy and occupational therapy. Surgery, if you have severe joint damage. Joint replacement or fusing of joints may be needed. Your health care provider will work with you to identify the best treatment option for you based on assessment of the overall disease activity in your body. Follow these instructions at home: Managing pain, stiffness, and swelling If directed, apply heat to the affected area as often as told by your health care provider. Use the heat source that your health care provider recommends, such as a moist heat pack or a heating pad. Place a towel between your skin and the heat source. Leave the heat on for  20-30 minutes. Remove the heat if your skin turns bright red. This is especially important if you are unable to feel pain, heat, or cold. You have a greater risk of getting burned.  Activity Return to your normal  activities as told by your health care provider. Ask your health care provider what activities are safe for you. Rest when you are having a flare. Start an exercise program as told by your health care provider. This may include physical therapy exercises to maintain movement and strength in your joints. General instructions Take over-the-counter and prescription medicines only as told by your health care provider. Keep all follow-up visits. This is important. Where to find more information American College of Rheumatology: rheumatology.org Arthritis Foundation: arthritis.org Contact a health care provider if: You have a flare-up of RA symptoms. You have a fever. You have side effects from your medicines. Get help right away if: You have chest pain. You have trouble breathing. You quickly develop a hot, painful joint that is more severe than your usual joint aches. These symptoms may be an emergency. Get help right away. Call 911. Do not wait to see if the symptoms will go away. Do not drive yourself to the hospital. Summary Rheumatoid arthritis (RA) is a long-term (chronic) disease that causes inflammation in the joints. RA is an autoimmune disease. The goals of treatment are to relieve pain, reduce inflammation, and slow down or stop joint damage and disability. This information is not intended to replace advice given to you by your health care provider. Make sure you discuss any questions you have with your health care provider. Document Revised: 05/04/2021 Document Reviewed: 05/04/2021 Elsevier Patient Education  2023 Elsevier Inc.  

## 2021-12-02 LAB — CBC WITH DIFFERENTIAL/PLATELET
Basophils Absolute: 0.1 10*3/uL (ref 0.0–0.2)
Basos: 1 %
EOS (ABSOLUTE): 0.2 10*3/uL (ref 0.0–0.4)
Eos: 3 %
Hematocrit: 35.3 % (ref 34.0–46.6)
Hemoglobin: 11.7 g/dL (ref 11.1–15.9)
Immature Grans (Abs): 0 10*3/uL (ref 0.0–0.1)
Immature Granulocytes: 0 %
Lymphocytes Absolute: 2.1 10*3/uL (ref 0.7–3.1)
Lymphs: 28 %
MCH: 31.1 pg (ref 26.6–33.0)
MCHC: 33.1 g/dL (ref 31.5–35.7)
MCV: 94 fL (ref 79–97)
Monocytes Absolute: 0.6 10*3/uL (ref 0.1–0.9)
Monocytes: 8 %
Neutrophils Absolute: 4.4 10*3/uL (ref 1.4–7.0)
Neutrophils: 60 %
Platelets: 346 10*3/uL (ref 150–450)
RBC: 3.76 x10E6/uL — ABNORMAL LOW (ref 3.77–5.28)
RDW: 13.1 % (ref 11.7–15.4)
WBC: 7.4 10*3/uL (ref 3.4–10.8)

## 2021-12-02 LAB — CMP14+EGFR
ALT: 9 IU/L (ref 0–32)
AST: 19 IU/L (ref 0–40)
Albumin/Globulin Ratio: 0.9 — ABNORMAL LOW (ref 1.2–2.2)
Albumin: 4 g/dL (ref 3.6–4.6)
Alkaline Phosphatase: 79 IU/L (ref 44–121)
BUN/Creatinine Ratio: 23 (ref 12–28)
BUN: 16 mg/dL (ref 8–27)
Bilirubin Total: 0.3 mg/dL (ref 0.0–1.2)
CO2: 28 mmol/L (ref 20–29)
Calcium: 9.3 mg/dL (ref 8.7–10.3)
Chloride: 97 mmol/L (ref 96–106)
Creatinine, Ser: 0.71 mg/dL (ref 0.57–1.00)
Globulin, Total: 4.3 g/dL (ref 1.5–4.5)
Glucose: 87 mg/dL (ref 70–99)
Potassium: 3.8 mmol/L (ref 3.5–5.2)
Sodium: 141 mmol/L (ref 134–144)
Total Protein: 8.3 g/dL (ref 6.0–8.5)
eGFR: 83 mL/min/{1.73_m2} (ref >59)

## 2021-12-02 LAB — TSH: TSH: 15.2 u[IU]/mL — ABNORMAL HIGH (ref 0.450–4.500)

## 2021-12-04 ENCOUNTER — Other Ambulatory Visit: Payer: Self-pay | Admitting: Family

## 2021-12-04 MED ORDER — LEVOTHYROXINE SODIUM 75 MCG PO TABS
75.0000 ug | ORAL_TABLET | Freq: Every day | ORAL | 1 refills | Status: DC
Start: 2021-12-04 — End: 2022-01-30

## 2021-12-12 ENCOUNTER — Telehealth: Payer: Self-pay | Admitting: Family

## 2021-12-13 NOTE — Telephone Encounter (Signed)
[employer] FMLA was faxed last Thursday late in the afternoon

## 2022-01-29 ENCOUNTER — Encounter: Payer: Self-pay | Admitting: Family

## 2022-01-29 ENCOUNTER — Ambulatory Visit (INDEPENDENT_AMBULATORY_CARE_PROVIDER_SITE_OTHER): Payer: Medicare Other | Admitting: Family

## 2022-01-29 VITALS — BP 137/76 | HR 76 | Temp 98.5°F | Ht <= 58 in | Wt 113.0 lb

## 2022-01-29 DIAGNOSIS — M0579 Rheumatoid arthritis with rheumatoid factor of multiple sites without organ or systems involvement: Secondary | ICD-10-CM | POA: Diagnosis not present

## 2022-01-29 DIAGNOSIS — E039 Hypothyroidism, unspecified: Secondary | ICD-10-CM

## 2022-01-29 DIAGNOSIS — I10 Essential (primary) hypertension: Secondary | ICD-10-CM

## 2022-01-29 DIAGNOSIS — E559 Vitamin D deficiency, unspecified: Secondary | ICD-10-CM | POA: Diagnosis not present

## 2022-01-29 NOTE — Patient Instructions (Signed)

## 2022-01-29 NOTE — Progress Notes (Signed)
Subjective:    Patient ID: Ashley Nelson, female    DOB: October 12, 1932, 88 y.o.   MRN: [id number]  Chief Complaint  Patient presents with   Medical Management of Chronic Issues   Thyroid Problem   Pt presents to the office today to follow up on Thyroid and RA. She is not following up with her Rheumatologists because she was told "they couldn't do anything else for her". Her daughter had moved in with her to help cook and drive for her. However, states her pain has improved and is able to drive. Reports aching pain of 6 out 10.   Her TSH was elevated to 15 and her levothyroxine increased to 75 mcg.  Thyroid Problem Presents for follow-up visit. Symptoms include fatigue. Patient reports no constipation, depressed mood, diarrhea, hoarse voice or tremors. The symptoms have been stable.  Hypertension This is a chronic problem. The current episode started more than 1 year ago. The problem has been resolved since onset. The problem is controlled. Associated symptoms include malaise/fatigue and peripheral edema. Pertinent negatives include no shortness of breath. Risk factors for coronary artery disease include dyslipidemia. The current treatment provides moderate improvement. Identifiable causes of hypertension include a thyroid problem.  Arthritis Presents for follow-up visit. She complains of pain and stiffness. The symptoms have been improving. Affected locations include the right MCP, left MCP, left knee and right knee. Her pain is at a severity of 6/10. Associated symptoms include fatigue. Pertinent negatives include no diarrhea.      Review of Systems  Constitutional:  Positive for fatigue and malaise/fatigue.  HENT:  Negative for hoarse voice.   Respiratory:  Negative for shortness of breath.   Gastrointestinal:  Negative for constipation and diarrhea.  Musculoskeletal:  Positive for arthritis and stiffness.  Neurological:  Negative for tremors.  All other systems reviewed and are  negative.      Objective:   Physical Exam Vitals reviewed.  Constitutional:      General: She is not in acute distress.    Appearance: She is well-developed.  HENT:     Head: Normocephalic and atraumatic.     Right Ear: Tympanic membrane normal.     Left Ear: Tympanic membrane normal.  Eyes:     Pupils: Pupils are equal, round, and reactive to light.  Neck:     Thyroid: No thyromegaly.  Cardiovascular:     Rate and Rhythm: Normal rate and regular rhythm.     Heart sounds: Normal heart sounds. No murmur heard. Pulmonary:     Effort: Pulmonary effort is normal. No respiratory distress.     Breath sounds: Normal breath sounds. No wheezing.  Abdominal:     General: Bowel sounds are normal. There is no distension.     Palpations: Abdomen is soft.     Tenderness: There is no abdominal tenderness.  Musculoskeletal:        General: No tenderness. Normal range of motion.     Cervical back: Normal range of motion and neck supple.     Right lower leg: Edema (trace) present.     Left lower leg: Edema (trace) present.  Skin:    General: Skin is warm and dry.  Neurological:     Mental Status: She is alert and oriented to person, place, and time.     Cranial Nerves: No cranial nerve deficit.     Deep Tendon Reflexes: Reflexes are normal and symmetric.  Psychiatric:        Behavior: Behavior normal.  Thought Content: Thought content normal.        Judgment: Judgment normal.       BP 137/76   Pulse 76   Temp 98.5 F (36.9 C)   Ht _0  (1.473 m)   Wt 113 lb (51.3 kg)   SpO2 98%   BMI 23.62 kg/m      Assessment & Plan:  Ashley Nelson comes in today with chief complaint of Medical Management of Chronic Issues and Thyroid Problem   Diagnosis and orders addressed:  1. Essential hypertension - CMP14+EGFR  2. Hypothyroidism, unspecified type - CMP14+EGFR - TSH  3. Rheumatoid arthritis involving multiple sites with positive rheumatoid factor ([place])  -  CMP14+EGFR    Labs pending Health Maintenance reviewed Diet and exercise encouraged  Follow up plan: 6 month  [name], FNP

## 2022-01-30 ENCOUNTER — Other Ambulatory Visit: Payer: Self-pay | Admitting: Family

## 2022-01-30 LAB — TSH: TSH: 8.51 u[IU]/mL — ABNORMAL HIGH (ref 0.450–4.500)

## 2022-01-30 LAB — CMP14+EGFR
ALT: 8 IU/L (ref 0–32)
AST: 18 IU/L (ref 0–40)
Albumin/Globulin Ratio: 1 — ABNORMAL LOW (ref 1.2–2.2)
Albumin: 3.8 g/dL (ref 3.7–4.7)
Alkaline Phosphatase: 72 IU/L (ref 44–121)
BUN/Creatinine Ratio: 16 (ref 12–28)
BUN: 12 mg/dL (ref 8–27)
Bilirubin Total: 0.6 mg/dL (ref 0.0–1.2)
CO2: 26 mmol/L (ref 20–29)
Calcium: 8.4 mg/dL — ABNORMAL LOW (ref 8.7–10.3)
Chloride: 101 mmol/L (ref 96–106)
Creatinine, Ser: 0.76 mg/dL (ref 0.57–1.00)
Globulin, Total: 4 g/dL (ref 1.5–4.5)
Glucose: 88 mg/dL (ref 70–99)
Potassium: 3.6 mmol/L (ref 3.5–5.2)
Sodium: 141 mmol/L (ref 134–144)
Total Protein: 7.8 g/dL (ref 6.0–8.5)
eGFR: 76 mL/min/{1.73_m2} (ref >59)

## 2022-01-30 MED ORDER — LEVOTHYROXINE SODIUM 88 MCG PO TABS: 88.0000 ug | ORAL_TABLET | Freq: Every day | ORAL | 1 refills | Status: DC

## 2022-02-05 ENCOUNTER — Other Ambulatory Visit: Payer: Self-pay | Admitting: Family

## 2022-02-05 MED ORDER — VITAMIN D (ERGOCALCIFEROL) 1.25 MG (50000 UNIT) PO CAPS: 50000.0000 [IU] | ORAL_CAPSULE | ORAL | 3 refills | Status: DC

## 2022-02-26 LAB — SPECIMEN STATUS REPORT

## 2022-02-26 LAB — PARATHYROID HORMONE, INTACT (NO CA)

## 2022-02-26 LAB — VITAMIN D 25 HYDROXY (VIT D DEFICIENCY, FRACTURES): Vit D, 25-Hydroxy: 29.1 ng/mL — ABNORMAL LOW (ref 30.0–100.0)

## 2022-03-08 DIAGNOSIS — M5136 Other intervertebral disc degeneration, lumbar region: Secondary | ICD-10-CM | POA: Diagnosis not present

## 2022-03-08 DIAGNOSIS — W1839XA Other fall on same level, initial encounter: Secondary | ICD-10-CM | POA: Diagnosis not present

## 2022-03-08 DIAGNOSIS — S22088A Other fracture of T11-T12 vertebra, initial encounter for closed fracture: Secondary | ICD-10-CM | POA: Diagnosis not present

## 2022-03-08 DIAGNOSIS — S0990XA Unspecified injury of head, initial encounter: Secondary | ICD-10-CM | POA: Diagnosis not present

## 2022-03-08 DIAGNOSIS — M4854XA Collapsed vertebra, not elsewhere classified, thoracic region, initial encounter for fracture: Secondary | ICD-10-CM | POA: Diagnosis not present

## 2022-03-08 DIAGNOSIS — M545 Low back pain, unspecified: Secondary | ICD-10-CM | POA: Diagnosis not present

## 2022-03-08 DIAGNOSIS — E785 Hyperlipidemia, unspecified: Secondary | ICD-10-CM | POA: Diagnosis not present

## 2022-03-08 DIAGNOSIS — S0003XA Contusion of scalp, initial encounter: Secondary | ICD-10-CM | POA: Diagnosis not present

## 2022-03-08 DIAGNOSIS — I6523 Occlusion and stenosis of bilateral carotid arteries: Secondary | ICD-10-CM | POA: Diagnosis not present

## 2022-03-08 DIAGNOSIS — I10 Essential (primary) hypertension: Secondary | ICD-10-CM | POA: Diagnosis not present

## 2022-03-08 DIAGNOSIS — M4316 Spondylolisthesis, lumbar region: Secondary | ICD-10-CM | POA: Diagnosis not present

## 2022-03-13 ENCOUNTER — Ambulatory Visit (INDEPENDENT_AMBULATORY_CARE_PROVIDER_SITE_OTHER): Payer: Medicare Other | Admitting: Family Medicine

## 2022-03-13 ENCOUNTER — Encounter: Payer: Self-pay | Admitting: Family Medicine

## 2022-03-13 ENCOUNTER — Ambulatory Visit: Payer: Medicare Other

## 2022-03-13 VITALS — BP 128/64 | HR 84 | Temp 98.0°F | Ht <= 58 in | Wt 108.0 lb

## 2022-03-13 DIAGNOSIS — E559 Vitamin D deficiency, unspecified: Secondary | ICD-10-CM

## 2022-03-13 DIAGNOSIS — S22080D Wedge compression fracture of T11-T12 vertebra, subsequent encounter for fracture with routine healing: Secondary | ICD-10-CM

## 2022-03-13 DIAGNOSIS — S0003XD Contusion of scalp, subsequent encounter: Secondary | ICD-10-CM

## 2022-03-13 DIAGNOSIS — S22080A Wedge compression fracture of T11-T12 vertebra, initial encounter for closed fracture: Secondary | ICD-10-CM

## 2022-03-13 DIAGNOSIS — S0003XA Contusion of scalp, initial encounter: Secondary | ICD-10-CM

## 2022-03-13 MED ORDER — TRAMADOL HCL 50 MG PO TABS: 50.0000 mg | ORAL_TABLET | Freq: Two times a day (BID) | ORAL | 0 refills | Status: DC | PRN

## 2022-03-13 NOTE — Progress Notes (Signed)
Subjective: CC: Hospital follow-up PCP: Sharion Balloon, FNP [id number] Ashley Nelson is a 88 y.o. female presenting to clinic today for:  1.  ER follow-up for fall Patient sustained a fall that was mechanical.  She lives at home but apparently will now be moving in with her daughter as they are concerned about recurrent falls in this patient.  She has ongoing back pain.  She has a fracture that was identified on imaging in the thoracic spine.  Imaging of the brain showed no acute bleeding but she did suffer a contusion of the head as well.  She does report pain when she gets up from a seated position in her back but otherwise is ambulating independently and does not want to have any type of surgical intervention nor a walker.  She would not be opposed to seeing a physical therapist and talking to a surgeon but thinks that this may need to be done in [place] as she again plans on moving in with her daughter.  She is using tramadol which does help some but does not fully relieve the pain.  She cites difficulty laying around as directed by the ER provider.  She is compliant with the vitamin D but is not on any calcium supplementation.   ROS: Per HPI  Allergies  Allergen Reactions   Plasticized Base [Plastibase]    Past Medical History:  Diagnosis Date   Cancer ([place])    cervical?   Hypertension    Osteoporosis    RA (rheumatoid arthritis) ([place]) 2015   Thyroid disease     Current Outpatient Medications:    atorvastatin (LIPITOR) 20 MG tablet, Take 1 tablet (20 mg total) by mouth daily., Disp: 90 tablet, Rfl: 3   hydrochlorothiazide (HYDRODIURIL) 12.5 MG tablet, Take 1 tablet (12.5 mg total) by mouth daily., Disp: 90 tablet, Rfl: 3   leflunomide (ARAVA) 10 MG tablet, , Disp: , Rfl:    levothyroxine (SYNTHROID) 88 MCG tablet, Take 1 tablet (88 mcg total) by mouth daily., Disp: 90 tablet, Rfl: 1   meloxicam (MOBIC) 7.5 MG tablet, Take 1 tablet (7.5 mg total) by mouth daily., Disp: 90 tablet,  Rfl: 2   mirabegron ER (MYRBETRIQ) 25 MG TB24 tablet, Take 1 tablet (25 mg total) by mouth daily., Disp: 90 tablet, Rfl: 3   Vitamin D, Ergocalciferol, (DRISDOL) 1.25 MG (50000 UNIT) CAPS capsule, Take 1 capsule (50,000 Units total) by mouth every 7 (seven) days., Disp: 12 capsule, Rfl: 3 Social History   Socioeconomic History   Marital status: Widowed    Spouse name: Not on file   Number of children: Not on file   Years of education: Not on file   Highest education level: Not on file  Occupational History   Not on file  Tobacco Use   Smoking status: Never   Smokeless tobacco: Never  Vaping Use   Vaping Use: Never used  Substance and Sexual Activity   Alcohol use: No   Drug use: No   Sexual activity: Not on file  Other Topics Concern   Not on file  Social History Narrative   Not on file   Social Determinants of Health   Financial Resource Strain: Not on file  Food Insecurity: Not on file  Transportation Needs: Not on file  Physical Activity: Not on file  Stress: Not on file  Social Connections: Not on file  Intimate Partner Violence: Not on file   No family history on file.  Objective: Office vital signs reviewed.  BP 128/64   Pulse 84   Temp 98 F (36.7 C)   Ht '4\' 10"'$  (1.473 m)   Wt 108 lb (49 kg)   SpO2 94%   BMI 22.57 kg/m   Physical Examination:  General: Awake, alert, non toxic appearing elderly female MSK: Visible pain with rising from a seated position.  Increased thoracic kyphosis noted.  Ambulating independently but gait is antalgic. Neuro: No neurologic deficits identified  Assessment/ Plan: 88 y.o. female   Compression fracture of T12 vertebra with routine healing, subsequent encounter - Plan: Calcium, VITAMIN D 25 Hydroxy (Vit-D Deficiency, Fractures), traMADol (ULTRAM) 50 MG tablet  Contusion of scalp, subsequent encounter  Hypocalcemia - Plan: Calcium  Vitamin D deficiency - Plan: VITAMIN D 25 Hydroxy (Vit-D Deficiency, Fractures)  I  reviewed her ER summary.  I am going to recheck calcium and vitamin D for this patient.  I think she might benefit from Miacalcin nasal spray for both pain relief related to the fracture and to help heal it.  However, we discussed that vitamin D and calcium level would need to be normal for initiation.  For this reason I have extended her tramadol for as needed use at this time.  She is to follow-up with PCP as scheduled in the next 2-1/2 weeks, sooner if concerns arise.  She will let us know where she would like referral to for physical therapy and orthopedics that she may be moving in with her daughter who lives in [place] has been reviewed.  There were no red flags.     No orders of the defined types were placed in this encounter.  No orders of the defined types were placed in this encounter.    Janora Norlander, DO [place] [phone]

## 2022-03-13 NOTE — Patient Instructions (Signed)
You may be able to use Miacalcin nasal spray to help with pain from the back fracture.  I am checking your calcium and vit d levels first and then will call medication in if they are normal.   Let me or Alyse Low know where you want the referral for physical therapy and orthopedic sent to.  Can get it here or [place], just need to know what town.  Lumbar Spine Fracture A lumbar spine fracture is a break in one of the bones of the lower back. Lumbar spine fractures can vary from mild to severe. The most severe types are those that: Cause the broken bones to move out of place (unstable). Injure or press on the spinal cord. Have multiple breaks in the bones and damage to nearby tissues (complex). During recovery, it is normal to have pain and stiffness in the lower back for several weeks. What are the causes? This condition may be caused by: A fall. A car accident. A gunshot wound. A hard, direct hit to the back. What increases the risk? You are more likely to develop this condition if: You are in a situation that could result in a fall or other violent injury. You have a condition that causes weakness in the bones (osteoporosis). What are the signs or symptoms? The main symptom of this condition is severe pain in the lower back. If a fracture is complex or severe, there may also be: An unusual shape or swollen area on the lower back. Limited ability to move an area of the lower back. Inability to empty the bladder (urinary retention). Inability to control when you urinate or have bowel movements (incontinence). Loss of strength or sensation in the legs, feet, and toes. Inability to move (paralysis). How is this diagnosed? This condition is diagnosed based on: A physical exam. Symptoms and what happened just before they developed. The results of imaging tests, such as an X-ray, CT scan, or MRI. If your nerves have been damaged, you may also have other tests to find out the extent of the  damage. How is this treated? Treatment for this condition depends on how severe the injury is. Most fractures can be treated with: A back brace. Bed rest and limits on your activity. Pain medicine. Physical therapy. Fractures that are complex, involve multiple bones, or make the spine unstable may require surgery. Surgery is done: To remove pressure from the nerves or spinal cord. To stabilize the broken pieces of bone. Follow these instructions at home: Medicines Take over-the-counter and prescription medicines only as told by your health care provider. Ask your health care provider if the medicine prescribed to you: Requires you to avoid driving or using machinery. Can cause constipation. You may need to take these actions to prevent or treat constipation: Drink enough fluid to keep your urine pale yellow. Take over-the-counter or prescription medicines. Eat foods that are high in fiber, such as beans, whole grains, and fresh fruits and vegetables. Limit foods that are high in fat and processed sugars, such as fried or sweet foods. If you have a back brace: Wear the back brace as told by your health care provider. Remove it only as told by your health care provider. Check the skin around the brace every day. Tell your health care provider about any concerns. Keep the brace clean. If the brace is not waterproof: Do not let it get wet. Cover it with a watertight covering when you take a bath or a shower. Ask your health care  provider when it is safe to drive. Activity Rest as told by your health care provider. Do exercises as told by your health care provider. Return to your normal activities as told by your health care provider. Ask your health care provider what activities are safe for you. Managing pain, stiffness, and swelling If directed, put ice on the injured area. To do this: If you have a removable brace, remove it only as told by your health care provider. Put ice in a  plastic bag. Place a towel between your skin and the bag. Leave the ice on for 20 minutes, 2-3 times a day. If your skin turns bright red, remove the ice right away to prevent skin damage. The risk of skin damage is higher if you cannot feel pain, heat, or cold.  General instructions Do not use any products that contain nicotine or tobacco. These products include cigarettes, chewing tobacco, and vaping devices, such e-cigarettes. These can delay bone healing. If you need help quitting, ask your health care provider. Do not drink alcohol. Keep all follow-up visits. Failing to follow up as recommended could result in permanent injury, disability, or long-lasting (chronic) pain. Where to find more information American Academy of Orthopaedic Surgeons: orthoinfo.aaos.org Contact a health care provider if: You have a fever. Your pain medicine is not helping. Your pain does not get better over time. You cannot return to your normal activities as planned or expected. Get help right away if: You have difficulty breathing. Your pain is very bad and it suddenly gets worse. You have numbness, tingling, or weakness in any part of your body. You are unable to empty your bladder. You cannot control when you urinate or have bowel movements. You are unable to move any body part that is below the level of your injury. You have pain in your abdomen or uncontrolled vomiting. You have a warm, tender swelling in your leg. These symptoms may be an emergency. Get help right away. Call 911. Do not wait to see if the symptoms will go away. Do not drive yourself to the hospital. Summary A lumbar spine fracture is a break in one of the bones of the lower back. The main symptom of this condition is severe pain in the lower back. If a fracture is complex or severe, there may also be numbness, tingling, or paralysis in the legs. Most fractures can be treated with a back brace, pain medicine, bed rest and limits on  activity, and physical therapy. Fractures that are complex, involve multiple bones, or make the spine unstable may require surgery. This information is not intended to replace advice given to you by your health care provider. Make sure you discuss any questions you have with your health care provider. Document Revised: 10/27/2021 Document Reviewed: 10/27/2021 Elsevier Patient Education  [place].

## 2022-03-14 LAB — VITAMIN D 25 HYDROXY (VIT D DEFICIENCY, FRACTURES): Vit D, 25-Hydroxy: 50.9 ng/mL (ref 30.0–100.0)

## 2022-03-14 LAB — CALCIUM: Calcium: 8.6 mg/dL — ABNORMAL LOW (ref 8.7–10.3)

## 2022-04-03 ENCOUNTER — Encounter: Payer: Self-pay | Admitting: Family

## 2022-04-03 ENCOUNTER — Ambulatory Visit (INDEPENDENT_AMBULATORY_CARE_PROVIDER_SITE_OTHER): Payer: Medicare Other | Admitting: Family

## 2022-04-03 VITALS — BP 151/79 | HR 77 | Temp 97.2°F | Ht <= 58 in | Wt 107.0 lb

## 2022-04-03 DIAGNOSIS — S22080D Wedge compression fracture of T11-T12 vertebra, subsequent encounter for fracture with routine healing: Secondary | ICD-10-CM | POA: Diagnosis not present

## 2022-04-03 DIAGNOSIS — E559 Vitamin D deficiency, unspecified: Secondary | ICD-10-CM | POA: Diagnosis not present

## 2022-04-03 DIAGNOSIS — W19XXXS Unspecified fall, sequela: Secondary | ICD-10-CM

## 2022-04-03 DIAGNOSIS — M810 Age-related osteoporosis without current pathological fracture: Secondary | ICD-10-CM | POA: Diagnosis not present

## 2022-04-03 DIAGNOSIS — Y92009 Unspecified place in unspecified non-institutional (private) residence as the place of occurrence of the external cause: Secondary | ICD-10-CM | POA: Diagnosis not present

## 2022-04-03 MED ORDER — TRAMADOL HCL 50 MG PO TABS: 50.0000 mg | ORAL_TABLET | Freq: Two times a day (BID) | ORAL | 0 refills | Status: DC | PRN

## 2022-04-03 NOTE — Progress Notes (Addendum)
Subjective:    Patient ID: Ashley Nelson, female    DOB: 01/31/1933, 88 y.o.   MRN: [id number]  Chief Complaint  Patient presents with   Hoarse    From a fall    Pt presents to the office today for a fall in her kitchen about three weeks ago. She went to the ED because she hit her head. She had a T12 vertebra compression fracture. Negative for bleed, but was diagnosed with a contusion of her head.   She is currently living by herself at this time, but is moving in with her daughter in [place] in the next few weeks.  Back Pain This is a recurrent problem. The current episode started 1 to 4 weeks ago. The problem has been waxing and waning since onset. The pain is present in the thoracic spine. The quality of the pain is described as aching. The pain is at a severity of 10/10. The pain is moderate. Risk factors include history of osteoporosis. She has tried analgesics for the symptoms. The treatment provided mild relief.      Review of Systems  Musculoskeletal:  Positive for back pain.  All other systems reviewed and are negative.      Objective:   Physical Exam Vitals reviewed.  Constitutional:      General: She is not in acute distress.    Appearance: She is well-developed.  HENT:     Head: Normocephalic and atraumatic.  Eyes:     Pupils: Pupils are equal, round, and reactive to light.  Neck:     Thyroid: No thyromegaly.  Cardiovascular:     Rate and Rhythm: Normal rate and regular rhythm.     Heart sounds: Normal heart sounds. No murmur heard. Pulmonary:     Effort: Pulmonary effort is normal. No respiratory distress.     Breath sounds: Normal breath sounds. No wheezing.  Abdominal:     General: Bowel sounds are normal. There is no distension.     Palpations: Abdomen is soft.     Tenderness: There is no abdominal tenderness.  Musculoskeletal:        General: Tenderness present.     Cervical back: Normal range of motion and neck supple.     Comments: Pain in lumbar and  thoracic area with flexion and rotation  Skin:    General: Skin is warm and dry.  Neurological:     Mental Status: She is alert and oriented to person, place, and time.     Cranial Nerves: No cranial nerve deficit.     Deep Tendon Reflexes: Reflexes are normal and symmetric.  Psychiatric:        Behavior: Behavior normal.        Thought Content: Thought content normal.        Judgment: Judgment normal.          BP (!) 151/79   Pulse 77   Temp (!) 97.2 F (36.2 C) (Temporal)   Ht _0  (1.473 m)   Wt 107 lb (48.5 kg)   BMI 22.36 kg/m   Assessment & Plan:  Ashley Nelson comes in today with chief complaint of Hoarse (From a fall )   Diagnosis and orders addressed:  1. Compression fracture of T12 vertebra with routine healing, subsequent encounter - traMADol (ULTRAM) 50 MG tablet; Take 1 tablet (50 mg total) by mouth every 12 (twelve) hours as needed for severe pain.  Dispense: 20 tablet; Refill: 0 - Ambulatory referral to Orthopedic Surgery -  Ambulatory referral to Physical Therapy - CMP14+EGFR  2. Fall in home, sequela - Ambulatory referral to Orthopedic Surgery - Ambulatory referral to Physical Therapy - CMP14+EGFR  3. Osteoporosis, unspecified osteoporosis type, unspecified pathological fracture presence - Ambulatory referral to Orthopedic Surgery - Ambulatory referral to Physical Therapy - CMP14+EGFR  4. Vitamin D deficiency - CMP14+EGFR   Labs pending Will refill Ultram as needed. Reviewed in [place] controlled database, no red flags.  If calcium is normal will start Miacalcin.  Will go ahead and place referral to Ortho and PT as patient does not know any places in [place] at this time. She would benefit from these given her pain. When she does move she can transfer to an Ortho and PT in [place].   Follow up plan: 2 weeks   [name], FNP

## 2022-04-03 NOTE — Patient Instructions (Signed)

## 2022-04-04 LAB — CMP14+EGFR
ALT: 7 IU/L (ref 0–32)
AST: 16 IU/L (ref 0–40)
Albumin/Globulin Ratio: 1.2 (ref 1.2–2.2)
Albumin: 4.5 g/dL (ref 3.7–4.7)
Alkaline Phosphatase: 100 IU/L (ref 44–121)
BUN/Creatinine Ratio: 23 (ref 12–28)
BUN: 18 mg/dL (ref 8–27)
Bilirubin Total: 0.4 mg/dL (ref 0.0–1.2)
CO2: 23 mmol/L (ref 20–29)
Calcium: 9.8 mg/dL (ref 8.7–10.3)
Chloride: 100 mmol/L (ref 96–106)
Creatinine, Ser: 0.8 mg/dL (ref 0.57–1.00)
Globulin, Total: 3.8 g/dL (ref 1.5–4.5)
Glucose: 87 mg/dL (ref 70–99)
Potassium: 3.7 mmol/L (ref 3.5–5.2)
Sodium: 144 mmol/L (ref 134–144)
Total Protein: 8.3 g/dL (ref 6.0–8.5)
eGFR: 71 mL/min/{1.73_m2} (ref >59)

## 2022-04-05 ENCOUNTER — Other Ambulatory Visit: Payer: Self-pay | Admitting: Family

## 2022-04-05 MED ORDER — CALCITONIN (SALMON) 200 UNIT/ACT NA SOLN
1.0000 | Freq: Every day | NASAL | 12 refills | Status: DC
Start: 2022-04-05 — End: 2023-02-22

## 2022-04-12 ENCOUNTER — Telehealth: Payer: Self-pay | Admitting: Family

## 2022-04-12 NOTE — Telephone Encounter (Signed)
Appt made

## 2022-04-12 NOTE — Telephone Encounter (Signed)
Lmtcb.

## 2022-04-16 ENCOUNTER — Encounter: Payer: Self-pay | Admitting: Family

## 2022-04-16 ENCOUNTER — Ambulatory Visit (INDEPENDENT_AMBULATORY_CARE_PROVIDER_SITE_OTHER): Payer: Medicare Other | Admitting: Family

## 2022-04-16 VITALS — BP 155/82 | HR 91 | Temp 98.0°F | Ht <= 58 in | Wt 108.0 lb

## 2022-04-16 DIAGNOSIS — M810 Age-related osteoporosis without current pathological fracture: Secondary | ICD-10-CM | POA: Diagnosis not present

## 2022-04-16 DIAGNOSIS — S22080D Wedge compression fracture of T11-T12 vertebra, subsequent encounter for fracture with routine healing: Secondary | ICD-10-CM | POA: Diagnosis not present

## 2022-04-16 DIAGNOSIS — M0579 Rheumatoid arthritis with rheumatoid factor of multiple sites without organ or systems involvement: Secondary | ICD-10-CM | POA: Diagnosis not present

## 2022-04-16 DIAGNOSIS — S22080S Wedge compression fracture of T11-T12 vertebra, sequela: Secondary | ICD-10-CM

## 2022-04-16 NOTE — Patient Instructions (Signed)

## 2022-04-16 NOTE — Progress Notes (Signed)
Subjective:    Patient ID: Ashley Nelson, female    DOB: May 29, 1933, 88 y.o.   MRN: [id number]  Chief Complaint  Patient presents with   Follow-up   PT presents to the office today to recheck compression fracture of T12 after a fall. We gave her rx of Ultram  and did a referral to Ortho and PT. States she has not scheduled these appointments because she is moving with her daughter.   States her pain is greatly improved and is a 0 out 10 today. She drove to her appointment today.   She has RA and followed by Rheumatologists.   She was started on Miacalcin nasal spray for Osteoporosis. States her hand pain will not allow her to spray this.  Arthritis Presents for follow-up visit. She complains of pain and stiffness. The symptoms have been stable. Affected locations include the left MCP, right MCP, left knee and right knee. Her pain is at a severity of 2/10.      Review of Systems  Musculoskeletal:  Positive for arthritis and stiffness.  All other systems reviewed and are negative.      Objective:   Physical Exam Vitals reviewed.  Constitutional:      General: She is not in acute distress.    Appearance: She is well-developed.  HENT:     Head: Normocephalic and atraumatic.     Right Ear: Tympanic membrane normal.     Left Ear: Tympanic membrane normal.  Eyes:     Pupils: Pupils are equal, round, and reactive to light.  Neck:     Thyroid: No thyromegaly.  Cardiovascular:     Rate and Rhythm: Normal rate and regular rhythm.     Heart sounds: Normal heart sounds. No murmur heard. Pulmonary:     Effort: Pulmonary effort is normal. No respiratory distress.     Breath sounds: Normal breath sounds. No wheezing.  Abdominal:     General: Bowel sounds are normal. There is no distension.     Palpations: Abdomen is soft.     Tenderness: There is no abdominal tenderness.  Musculoskeletal:        General: Tenderness present.     Cervical back: Normal range of motion and neck  supple.     Comments: Pain bilateral hand with flexion  Skin:    General: Skin is warm and dry.  Neurological:     Mental Status: She is alert and oriented to person, place, and time.     Cranial Nerves: No cranial nerve deficit.     Deep Tendon Reflexes: Reflexes are normal and symmetric.  Psychiatric:        Behavior: Behavior normal.        Thought Content: Thought content normal.        Judgment: Judgment normal.      BP (!) 155/82   Pulse 91   Temp 98 F (36.7 C) (Temporal)   Ht '4\' 10"'$  (1.473 m)   Wt 108 lb (49 kg)   BMI 22.57 kg/m       Assessment & Plan:  Ashley Nelson comes in today with chief complaint of Follow-up   Diagnosis and orders addressed:  1. Rheumatoid arthritis involving multiple sites with positive rheumatoid factor ([place])  2. Compression fracture of T12 vertebra, sequela  3. Osteoporosis, unspecified osteoporosis type, unspecified pathological fracture presence - AMB Referral to [facility]  PT does not answer her phone most of the time. She has not scheduled appointment for Ortho  or PT.  States she is moving, but I encouraged her to see go see Ortho.  Fall precautions  Will set up an appointment with Clinical Pharmacists to discuss osteoporosis.  Follow up in 1 month   [name], FNP

## 2022-05-22 ENCOUNTER — Ambulatory Visit: Payer: Medicare Other | Admitting: Family

## 2022-05-28 ENCOUNTER — Telehealth: Payer: Self-pay | Admitting: Family

## 2022-05-28 DIAGNOSIS — E785 Hyperlipidemia, unspecified: Secondary | ICD-10-CM

## 2022-05-28 NOTE — Telephone Encounter (Signed)
  Prescription Request  05/28/2022  Is this a "Controlled Substance" medicine? atorvastatin (LIPITOR) 20 MG tablet   Have you seen your PCP in the last 2 weeks? 04/16/2022  If YES, route message to pool  -  If NO, patient needs to be scheduled for appointment.  What is the name of the medication or equipment? atorvastatin (LIPITOR) 20 MG tablet   Have you contacted your pharmacy to request a refill? no   Which pharmacy would you like this sent to? [facility] [place]   Patient notified that their request is being sent to the clinical staff for review and that they should receive a response within 2 business days.

## 2022-05-28 NOTE — Telephone Encounter (Signed)
Pt was needing her BP med not her cholesterol, she should have refills at the pharmacy on both of these medications. She has thyroid meds but instructed her that when she gets down to 3 weeks she needs another appt to recheck her labwork.

## 2022-10-22 ENCOUNTER — Other Ambulatory Visit: Payer: Self-pay | Admitting: Family

## 2022-11-29 ENCOUNTER — Telehealth: Payer: Self-pay | Admitting: Family

## 2022-11-29 DIAGNOSIS — I10 Essential (primary) hypertension: Secondary | ICD-10-CM

## 2022-11-29 MED ORDER — LEVOTHYROXINE SODIUM 88 MCG PO TABS: 88.0000 ug | ORAL_TABLET | Freq: Every day | ORAL | 0 refills | Status: DC

## 2022-11-29 MED ORDER — HYDROCHLOROTHIAZIDE 12.5 MG PO TABS
12.5000 mg | ORAL_TABLET | Freq: Every day | ORAL | 0 refills | Status: DC
Start: 2022-11-29 — End: 2023-02-22

## 2022-11-29 NOTE — Telephone Encounter (Signed)
  Prescription Request  11/29/2022  Is this a "Controlled Substance" medicine? no  Have you seen your PCP in the last 2 weeks? Not pt has appt on 06/14 is about to run out and will be out of country needs enough until she is seen  If YES, route message to pool  -  If NO, patient needs to be scheduled for appointment.  What is the name of the medication or equipment? levothyroxine (SYNTHROID) 88 MCG tablet   hydrochlorothiazide (HYDRODIURIL) 12.5 MG tablet  Have you contacted your pharmacy to request a refill? yes   Which pharmacy would you like this sent to? [facility] [place]   Patient notified that their request is being sent to the clinical staff for review and that they should receive a response within 2 business days.

## 2022-11-29 NOTE — Telephone Encounter (Signed)
Refill sent.

## 2022-12-28 ENCOUNTER — Ambulatory Visit: Payer: Medicare Other | Admitting: Family

## 2023-01-07 ENCOUNTER — Ambulatory Visit: Payer: Medicare Other | Admitting: Family

## 2023-01-07 ENCOUNTER — Encounter: Payer: Self-pay | Admitting: Family

## 2023-02-01 ENCOUNTER — Ambulatory Visit: Payer: Medicare Other | Admitting: Family

## 2023-02-22 ENCOUNTER — Encounter: Payer: Self-pay | Admitting: Family

## 2023-02-22 ENCOUNTER — Ambulatory Visit (INDEPENDENT_AMBULATORY_CARE_PROVIDER_SITE_OTHER): Payer: Medicare Other | Admitting: Family

## 2023-02-22 VITALS — BP 152/65 | HR 80 | Temp 97.9°F | Ht <= 58 in | Wt 111.0 lb

## 2023-02-22 DIAGNOSIS — M0579 Rheumatoid arthritis with rheumatoid factor of multiple sites without organ or systems involvement: Secondary | ICD-10-CM | POA: Diagnosis not present

## 2023-02-22 DIAGNOSIS — Z0001 Encounter for general adult medical examination with abnormal findings: Secondary | ICD-10-CM

## 2023-02-22 DIAGNOSIS — N3281 Overactive bladder: Secondary | ICD-10-CM | POA: Diagnosis not present

## 2023-02-22 DIAGNOSIS — I10 Essential (primary) hypertension: Secondary | ICD-10-CM

## 2023-02-22 DIAGNOSIS — M810 Age-related osteoporosis without current pathological fracture: Secondary | ICD-10-CM | POA: Diagnosis not present

## 2023-02-22 DIAGNOSIS — E039 Hypothyroidism, unspecified: Secondary | ICD-10-CM | POA: Diagnosis not present

## 2023-02-22 DIAGNOSIS — E559 Vitamin D deficiency, unspecified: Secondary | ICD-10-CM

## 2023-02-22 DIAGNOSIS — Z0000 Encounter for general adult medical examination without abnormal findings: Secondary | ICD-10-CM | POA: Diagnosis not present

## 2023-02-22 DIAGNOSIS — K219 Gastro-esophageal reflux disease without esophagitis: Secondary | ICD-10-CM | POA: Diagnosis not present

## 2023-02-22 DIAGNOSIS — E785 Hyperlipidemia, unspecified: Secondary | ICD-10-CM | POA: Diagnosis not present

## 2023-02-22 DIAGNOSIS — D649 Anemia, unspecified: Secondary | ICD-10-CM | POA: Diagnosis not present

## 2023-02-22 LAB — CMP14+EGFR

## 2023-02-22 LAB — LIPID PANEL

## 2023-02-22 LAB — VITAMIN D 25 HYDROXY (VIT D DEFICIENCY, FRACTURES)

## 2023-02-22 LAB — CBC WITH DIFFERENTIAL/PLATELET
Basophils Absolute: 0.1 10*3/uL (ref 0.0–0.2)
Basos: 1 %
EOS (ABSOLUTE): 0.2 10*3/uL (ref 0.0–0.4)
Eos: 2 %
Hematocrit: 33.1 % — ABNORMAL LOW (ref 34.0–46.6)
Hemoglobin: 10.6 g/dL — ABNORMAL LOW (ref 11.1–15.9)
Immature Grans (Abs): 0 10*3/uL (ref 0.0–0.1)
Immature Granulocytes: 0 %
Lymphocytes Absolute: 2 10*3/uL (ref 0.7–3.1)
Lymphs: 25 %
MCH: 29.6 pg (ref 26.6–33.0)
MCHC: 32 g/dL (ref 31.5–35.7)
MCV: 93 fL (ref 79–97)
Monocytes Absolute: 0.6 10*3/uL (ref 0.1–0.9)
Monocytes: 7 %
Neutrophils Absolute: 5.2 10*3/uL (ref 1.4–7.0)
Neutrophils: 65 %
Platelets: 254 10*3/uL (ref 150–450)
RBC: 3.58 x10E6/uL — ABNORMAL LOW (ref 3.77–5.28)
RDW: 13.3 % (ref 11.7–15.4)
WBC: 8 10*3/uL (ref 3.4–10.8)

## 2023-02-22 LAB — TSH

## 2023-02-22 MED ORDER — LEVOTHYROXINE SODIUM 88 MCG PO TABS: 88.0000 ug | ORAL_TABLET | Freq: Every day | ORAL | 1 refills | Status: DC

## 2023-02-22 MED ORDER — HYDROCHLOROTHIAZIDE 12.5 MG PO TABS
12.5000 mg | ORAL_TABLET | Freq: Every day | ORAL | 2 refills | Status: AC
Start: 2023-02-22 — End: ?

## 2023-02-22 MED ORDER — ATORVASTATIN CALCIUM 20 MG PO TABS
20.0000 mg | ORAL_TABLET | Freq: Every day | ORAL | 3 refills | Status: AC
Start: 2023-02-22 — End: 2024-02-22

## 2023-02-22 MED ORDER — MELOXICAM 7.5 MG PO TABS
7.5000 mg | ORAL_TABLET | Freq: Every day | ORAL | 2 refills | Status: DC
Start: 2023-02-22 — End: 2023-09-27

## 2023-02-22 MED ORDER — MIRABEGRON ER 25 MG PO TB24
25.0000 mg | ORAL_TABLET | Freq: Every day | ORAL | 3 refills | Status: AC
Start: 2023-02-22 — End: ?

## 2023-02-22 NOTE — Patient Instructions (Signed)
Ganglion Cyst  A ganglion cyst is a non-cancerous, fluid-filled lump of tissue that occurs near a joint, tendon, or ligament. The cyst grows out of a joint or the lining of a tendon or ligament. Ganglion cysts most often develop in the hand or wrist, but they can also develop in the shoulder, elbow, hip, knee, ankle, or foot. Ganglion cysts are ball-shaped or egg-shaped. Their size can range from the size of a pea to larger than a grape. Increased activity may cause the cyst to get bigger because more fluid starts to build up. What are the causes? The exact cause of this condition is not known, but it may be related to: Inflammation or irritation around the joint. An injury or tear in the layers of tissue around the joint (joint capsule). Repetitive movements or overuse. History of acute or repeated injury. What increases the risk? You are more likely to develop this condition if: You are a female. You are 87-50 years old. What are the signs or symptoms? The main symptom of this condition is a lump. It most often appears on the hand or wrist. In many cases, there are no other symptoms, but a cyst can sometimes cause: Tingling. Pain or tenderness. Numbness. Weakness or loss of strength in the affected joint. Decreased range of motion in the affected area of the body. How is this diagnosed? Ganglion cysts are usually diagnosed based on a physical exam. Your health care provider will feel the lump and may shine a light next to it. If it is a ganglion cyst, the light will likely shine through it. Your health care provider may order an X-ray, ultrasound, MRI, or CT scan to rule out other conditions. How is this treated? Ganglion cysts often go away on their own without treatment. If you have pain or other symptoms, treatment may be needed. Treatment is also needed if the ganglion cyst limits your movement or if it gets infected. Treatment may include: Wearing a brace or splint on your wrist or  finger. Taking anti-inflammatory medicine. Having fluid drained from the lump with a needle (aspiration). Getting an injection of medicine into the joint to decrease inflammation. This may be corticosteroids, ethanol, or hyaluronidase. Having surgery to remove the ganglion cyst. Placing a pad in your shoe or wearing shoes that will not rub against the cyst if it is on your foot. Follow these instructions at home: Do not press on the ganglion cyst, poke it with a needle, or hit it. Take over-the-counter and prescription medicines only as told by your health care provider. If you have a brace or splint: Wear it as told by your health care provider. Remove it as told by your health care provider. Ask if you need to remove it when you take a shower or a bath. Watch your ganglion cyst for any changes. Keep all follow-up visits as told by your health care provider. This is important. Contact a health care provider if: Your ganglion cyst becomes larger or more painful. You have pus coming from the lump. You have weakness or numbness in the affected area. You have a fever or chills. Get help right away if: You have a fever and have any of these in the cyst area: Increased redness. Red streaks. Swelling. Summary A ganglion cyst is a non-cancerous, fluid-filled lump that occurs near a joint, tendon, or ligament. Ganglion cysts most often develop in the hand or wrist, but they can also develop in the shoulder, elbow, hip, knee, ankle, or  foot. Ganglion cysts often go away on their own without treatment. This information is not intended to replace advice given to you by your health care provider. Make sure you discuss any questions you have with your health care provider. Document Revised: 09/23/2019 Document Reviewed: 09/23/2019 Elsevier Patient Education  2024 [employer].

## 2023-02-22 NOTE — Progress Notes (Signed)
Subjective:    Patient ID: Ashley Nelson, female    DOB: 11-Oct-1932, 89 y.o.   MRN: [id number]  Chief Complaint  Patient presents with   Medical Management of Chronic Issues    Wants to discuss stuff. Patient fasting. Been out of all meds because she was out of town.    Pain    All over    Fatigue   PT presents to the office today for CPE. She has not been taking any of her medications for the last 5 days because she "ran out".   She has RA and followed by Rheumatologists.    She was started on Miacalcin nasal spray for Osteoporosis. States her hand pain will not allow her to spray this.  Thyroid Problem Presents for follow-up visit. Symptoms include fatigue. Patient reports no anxiety, constipation or dry skin. The symptoms have been stable.  Hypertension This is a chronic problem. The current episode started more than 1 year ago. The problem has been waxing and waning since onset. The problem is uncontrolled. Associated symptoms include malaise/fatigue and peripheral edema. Pertinent negatives include no shortness of breath. Risk factors for coronary artery disease include dyslipidemia and sedentary lifestyle. The current treatment provides moderate improvement. Identifiable causes of hypertension include a thyroid problem.  Gastroesophageal Reflux She complains of belching and heartburn. This is a chronic problem. The current episode started more than 1 year ago. The problem occurs occasionally. Associated symptoms include fatigue. She has tried a PPI and a diet change for the symptoms. The treatment provided moderate relief.  Arthritis Presents for follow-up visit. She complains of pain and stiffness. Affected locations include the right MCP, left MCP, right knee and left knee. Her pain is at a severity of 10/10. Associated symptoms include fatigue.  Urinary Frequency  This is a chronic problem. The current episode started more than 1 year ago. The problem occurs intermittently. The  patient is experiencing no pain. Associated symptoms include frequency.      Review of Systems  Constitutional:  Positive for fatigue and malaise/fatigue.  Respiratory:  Negative for shortness of breath.   Gastrointestinal:  Positive for heartburn. Negative for constipation.  Genitourinary:  Positive for frequency.  Musculoskeletal:  Positive for arthritis and stiffness.  Psychiatric/Behavioral:  The patient is not nervous/anxious.   All other systems reviewed and are negative.  History reviewed. No pertinent family history. Social History   Socioeconomic History   Marital status: Widowed    Spouse name: Not on file   Number of children: Not on file   Years of education: Not on file   Highest education level: Not on file  Occupational History   Not on file  Tobacco Use   Smoking status: Never   Smokeless tobacco: Never  Vaping Use   Vaping status: Never Used  Substance and Sexual Activity   Alcohol use: No   Drug use: No   Sexual activity: Not on file  Other Topics Concern   Not on file  Social History Narrative   Not on file   Social Determinants of Health   Financial Resource Strain: Not on file  Food Insecurity: Not on file  Transportation Needs: Not on file  Physical Activity: Not on file  Stress: Not on file  Social Connections: Not on file       Objective:   Physical Exam Vitals reviewed.  Constitutional:      General: She is not in acute distress.    Appearance: She is well-developed.  HENT:     Head: Normocephalic and atraumatic.     Right Ear: Tympanic membrane normal.     Left Ear: Tympanic membrane normal.  Eyes:     Pupils: Pupils are equal, round, and reactive to light.  Neck:     Thyroid: No thyromegaly.  Cardiovascular:     Rate and Rhythm: Normal rate and regular rhythm.     Heart sounds: Normal heart sounds. No murmur heard. Pulmonary:     Effort: Pulmonary effort is normal. No respiratory distress.     Breath sounds: Normal breath  sounds. No wheezing.  Abdominal:     General: Bowel sounds are normal. There is no distension.     Palpations: Abdomen is soft.     Tenderness: There is no abdominal tenderness.  Musculoskeletal:        General: Tenderness present. Normal range of motion.     Right wrist: Swelling present.     Left wrist: Swelling present.     Cervical back: Normal range of motion and neck supple.     Right lower leg: Edema (2+) present.     Left lower leg: Edema (2+) present.  Skin:    General: Skin is warm and dry.  Neurological:     Mental Status: She is alert and oriented to person, place, and time.     Cranial Nerves: No cranial nerve deficit.     Deep Tendon Reflexes: Reflexes are normal and symmetric.  Psychiatric:        Behavior: Behavior normal.        Thought Content: Thought content normal.        Judgment: Judgment normal.       BP (!) 152/65   Pulse 80   Temp 97.9 F (36.6 C) (Temporal)   Ht 4\' 10"  (1.473 m)   Wt 111 lb (50.3 kg)   SpO2 95%   BMI 23.20 kg/m      Assessment & Plan:  Yanilet Mow comes in today with chief complaint of Medical Management of Chronic Issues (Wants to discuss stuff. Patient fasting. Been out of all meds because she was out of town. ), Pain (All over ), and Fatigue   Diagnosis and orders addressed:  1. Hyperlipidemia, unspecified hyperlipidemia type - atorvastatin (LIPITOR) 20 MG tablet; Take 1 tablet (20 mg total) by mouth daily.  Dispense: 90 tablet; Refill: 3 - CBC with Differential/Platelet - Lipid panel - CMP14+EGFR  2. Essential hypertension - hydrochlorothiazide (HYDRODIURIL) 12.5 MG tablet; Take 1 tablet (12.5 mg total) by mouth daily.  Dispense: 90 tablet; Refill: 2 - CBC with Differential/Platelet - CMP14+EGFR  3. Annual physical exam - CBC with Differential/Platelet - Lipid panel - CMP14+EGFR - TSH - VITAMIN D 25 Hydroxy (Vit-D Deficiency, Fractures)  4. Gastroesophageal reflux disease, unspecified whether esophagitis  present - CBC with Differential/Platelet - CMP14+EGFR  5. Hypothyroidism, unspecified type - CBC with Differential/Platelet - CMP14+EGFR - TSH  6. Osteoporosis, unspecified osteoporosis type, unspecified pathological fracture presence - CBC with Differential/Platelet - CMP14+EGFR - TSH - VITAMIN D 25 Hydroxy (Vit-D Deficiency, Fractures)  7. Overactive bladder - CBC with Differential/Platelet - CMP14+EGFR - mirabegron ER (MYRBETRIQ) 25 MG TB24 tablet; Take 1 tablet (25 mg total) by mouth daily.  Dispense: 90 tablet; Refill: 3  8. Rheumatoid arthritis involving multiple sites with positive rheumatoid factor (HCC) - CBC with Differential/Platelet - CMP14+EGFR - meloxicam (MOBIC) 7.5 MG tablet; Take 1 tablet (7.5 mg total) by mouth daily.  Dispense: 90 tablet; Refill: 2  9. Vitamin D deficiency - CBC with Differential/Platelet - CMP14+EGFR - VITAMIN D 25 Hydroxy (Vit-D Deficiency, Fractures)   Labs pending Restart medications today! ROM exercises  Health Maintenance reviewed Diet and exercise encouraged  Follow up plan: 2 weeks to follow up after we refilled medications    Jannifer Rodney, FNP

## 2023-02-26 LAB — IRON AND TIBC

## 2023-02-26 LAB — SPECIMEN STATUS REPORT

## 2023-03-08 ENCOUNTER — Encounter: Payer: Self-pay | Admitting: Family

## 2023-03-08 ENCOUNTER — Ambulatory Visit (INDEPENDENT_AMBULATORY_CARE_PROVIDER_SITE_OTHER): Payer: Medicare Other | Admitting: Family

## 2023-03-08 VITALS — BP 136/80 | HR 75 | Temp 98.7°F | Ht <= 58 in | Wt 112.8 lb

## 2023-03-08 DIAGNOSIS — D509 Iron deficiency anemia, unspecified: Secondary | ICD-10-CM

## 2023-03-08 DIAGNOSIS — N3281 Overactive bladder: Secondary | ICD-10-CM

## 2023-03-08 DIAGNOSIS — I10 Essential (primary) hypertension: Secondary | ICD-10-CM | POA: Diagnosis not present

## 2023-03-08 DIAGNOSIS — M0579 Rheumatoid arthritis with rheumatoid factor of multiple sites without organ or systems involvement: Secondary | ICD-10-CM

## 2023-03-08 MED ORDER — IRON (FERROUS SULFATE) 325 (65 FE) MG PO TABS
325.0000 mg | ORAL_TABLET | Freq: Every day | ORAL | 1 refills | Status: AC
Start: 2023-03-08 — End: ?

## 2023-03-08 NOTE — Patient Instructions (Signed)
Hypertension, Adult High blood pressure (hypertension) is when the force of blood pumping through the arteries is too strong. The arteries are the blood vessels that carry blood from the heart throughout the body. Hypertension forces the heart to work harder to pump blood and may cause arteries to become narrow or stiff. Untreated or uncontrolled hypertension can lead to a heart attack, heart failure, a stroke, kidney disease, and other problems. A blood pressure reading consists of a higher number over a lower number. Ideally, your blood pressure should be below 120/80. The first ("top") number is called the systolic pressure. It is a measure of the pressure in your arteries as your heart beats. The second ("bottom") number is called the diastolic pressure. It is a measure of the pressure in your arteries as the heart relaxes. What are the causes? The exact cause of this condition is not known. There are some conditions that result in high blood pressure. What increases the risk? Certain factors may make you more likely to develop high blood pressure. Some of these risk factors are under your control, including: Smoking. Not getting enough exercise or physical activity. Being overweight. Having too much fat, sugar, calories, or salt (sodium) in your diet. Drinking too much alcohol. Other risk factors include: Having a personal history of heart disease, diabetes, high cholesterol, or kidney disease. Stress. Having a family history of high blood pressure and high cholesterol. Having obstructive sleep apnea. Age. The risk increases with age. What are the signs or symptoms? High blood pressure may not cause symptoms. Very high blood pressure (hypertensive crisis) may cause: Headache. Fast or irregular heartbeats (palpitations). Shortness of breath. Nosebleed. Nausea and vomiting. Vision changes. Severe chest pain, dizziness, and seizures. How is this diagnosed? This condition is diagnosed by  measuring your blood pressure while you are seated, with your arm resting on a flat surface, your legs uncrossed, and your feet flat on the floor. The cuff of the blood pressure monitor will be placed directly against the skin of your upper arm at the level of your heart. Blood pressure should be measured at least twice using the same arm. Certain conditions can cause a difference in blood pressure between your right and left arms. If you have a high blood pressure reading during one visit or you have normal blood pressure with other risk factors, you may be asked to: Return on a different day to have your blood pressure checked again. Monitor your blood pressure at home for 1 week or longer. If you are diagnosed with hypertension, you may have other blood or imaging tests to help your health care provider understand your overall risk for other conditions. How is this treated? This condition is treated by making healthy lifestyle changes, such as eating healthy foods, exercising more, and reducing your alcohol intake. You may be referred for counseling on a healthy diet and physical activity. Your health care provider may prescribe medicine if lifestyle changes are not enough to get your blood pressure under control and if: Your systolic blood pressure is above 130. Your diastolic blood pressure is above 80. Your personal target blood pressure may vary depending on your medical conditions, your age, and other factors. Follow these instructions at home: Eating and drinking  Eat a diet that is high in fiber and potassium, and low in sodium, added sugar, and fat. An example of this eating plan is called the DASH diet. DASH stands for Dietary Approaches to Stop Hypertension. To eat this way: Eat   plenty of fresh fruits and vegetables. Try to fill one half of your plate at each meal with fruits and vegetables. Eat whole grains, such as whole-wheat pasta, brown rice, or whole-grain bread. Fill about one  fourth of your plate with whole grains. Eat or drink low-fat dairy products, such as skim milk or low-fat yogurt. Avoid fatty cuts of meat, processed or cured meats, and poultry with skin. Fill about one fourth of your plate with lean proteins, such as fish, chicken without skin, beans, eggs, or tofu. Avoid pre-made and processed foods. These tend to be higher in sodium, added sugar, and fat. Reduce your daily sodium intake. Many people with hypertension should eat less than 1,500 mg of sodium a day. Do not drink alcohol if: Your health care provider tells you not to drink. You are pregnant, may be pregnant, or are planning to become pregnant. If you drink alcohol: Limit how much you have to: 0-1 drink a day for women. 0-2 drinks a day for men. Know how much alcohol is in your drink. In the [place] one drink equals one 12 oz bottle of beer (355 mL), one 5 oz glass of wine (148 mL), or one 1 oz glass of hard liquor (44 mL). Lifestyle  Work with your health care provider to maintain a healthy body weight or to lose weight. Ask what an ideal weight is for you. Get at least 30 minutes of exercise that causes your heart to beat faster (aerobic exercise) most days of the week. Activities may include walking, swimming, or biking. Include exercise to strengthen your muscles (resistance exercise), such as Pilates or lifting weights, as part of your weekly exercise routine. Try to do these types of exercises for 30 minutes at least 3 days a week. Do not use any products that contain nicotine or tobacco. These products include cigarettes, chewing tobacco, and vaping devices, such as e-cigarettes. If you need help quitting, ask your health care provider. Monitor your blood pressure at home as told by your health care provider. Keep all follow-up visits. This is important. Medicines Take over-the-counter and prescription medicines only as told by your health care provider. Follow directions carefully. Blood  pressure medicines must be taken as prescribed. Do not skip doses of blood pressure medicine. Doing this puts you at risk for problems and can make the medicine less effective. Ask your health care provider about side effects or reactions to medicines that you should watch for. Contact a health care provider if you: Think you are having a reaction to a medicine you are taking. Have headaches that keep coming back (recurring). Feel dizzy. Have swelling in your ankles. Have trouble with your vision. Get help right away if you: Develop a severe headache or confusion. Have unusual weakness or numbness. Feel faint. Have severe pain in your chest or abdomen. Vomit repeatedly. Have trouble breathing. These symptoms may be an emergency. Get help right away. Call 911. Do not wait to see if the symptoms will go away. Do not drive yourself to the hospital. Summary Hypertension is when the force of blood pumping through your arteries is too strong. If this condition is not controlled, it may put you at risk for serious complications. Your personal target blood pressure may vary depending on your medical conditions, your age, and other factors. For most people, a normal blood pressure is less than 120/80. Hypertension is treated with lifestyle changes, medicines, or a combination of both. Lifestyle changes include losing weight, eating a healthy,   low-sodium diet, exercising more, and limiting alcohol. This information is not intended to replace advice given to you by your health care provider. Make sure you discuss any questions you have with your health care provider. Document Revised: 05/09/2021 Document Reviewed: 05/09/2021 Elsevier Patient Education  2024 [employer]  

## 2023-03-08 NOTE — Progress Notes (Signed)
Subjective:    Patient ID: Ashley Nelson, female    DOB: 18-May-1933, 89 y.o.   MRN: [id number]  Chief Complaint  Patient presents with   Follow-up   Pt presents to the office today follow up on HTN. She was seen on 02/22/23 and we refilled her medications she had been out for the last two weeks.   She also restarted myrbetriq 25 mg daily. She states she has only taken this twice because there was a delay getting filled.   Her TSH was abnormal, but had not been taking this regularly. Continue to take 88 mcg.   She has RA and followed by Rheumatologists.  Hypertension This is a chronic problem. The current episode started more than 1 year ago. The problem has been waxing and waning since onset. The problem is uncontrolled. Associated symptoms include malaise/fatigue. Pertinent negatives include no peripheral edema or shortness of breath. Risk factors for coronary artery disease include dyslipidemia and sedentary lifestyle. Past treatments include diuretics. The current treatment provides mild improvement.  Urinary Frequency  This is a chronic problem. The current episode started more than 1 year ago. The patient is experiencing no pain. Associated symptoms include frequency and urgency. Pertinent negatives include no hematuria. She has tried increased fluids for the symptoms. The treatment provided no relief.  Anemia Presents for follow-up visit. Symptoms include malaise/fatigue.      Review of Systems  Constitutional:  Positive for malaise/fatigue.  Respiratory:  Negative for shortness of breath.   Genitourinary:  Positive for frequency and urgency. Negative for hematuria.  All other systems reviewed and are negative.      Objective:   Physical Exam Vitals reviewed.  Constitutional:      General: She is not in acute distress.    Appearance: She is well-developed.  HENT:     Head: Normocephalic and atraumatic.     Right Ear: Tympanic membrane normal.     Left Ear: Tympanic  membrane normal.  Eyes:     Pupils: Pupils are equal, round, and reactive to light.  Neck:     Thyroid: No thyromegaly.  Cardiovascular:     Rate and Rhythm: Normal rate and regular rhythm.     Heart sounds: Normal heart sounds. No murmur heard. Pulmonary:     Effort: Pulmonary effort is normal. No respiratory distress.     Breath sounds: Normal breath sounds. No wheezing.  Abdominal:     General: Bowel sounds are normal. There is no distension.     Palpations: Abdomen is soft.     Tenderness: There is no abdominal tenderness.  Musculoskeletal:        General: No tenderness. Normal range of motion.     Cervical back: Normal range of motion and neck supple.     Right lower leg: Edema (2+) present.     Left lower leg: Edema (2+) present.  Skin:    General: Skin is warm and dry.  Neurological:     Mental Status: She is alert and oriented to person, place, and time.     Cranial Nerves: No cranial nerve deficit.     Deep Tendon Reflexes: Reflexes are normal and symmetric.  Psychiatric:        Behavior: Behavior normal.        Thought Content: Thought content normal.        Judgment: Judgment normal.       BP 136/80   Pulse 75   Temp 98.7 F (37.1 C) (Temporal)  Ht 4\' 10"  (1.473 m)   Wt 112 lb 12.8 oz (51.2 kg)   SpO2 98%   BMI 23.58 kg/m      Assessment & Plan:   Ashley Nelson comes in today with chief complaint of Follow-up   Diagnosis and orders addressed:  1. Essential hypertension At goal at recheck  Continue current medications   2. Overactive bladder Limit caffeine   3. Iron deficiency anemia, unspecified iron deficiency anemia type Start iron  Iron rich diet  - Iron, Ferrous Sulfate, 325 (65 Fe) MG TABS; Take 325 mg by mouth daily.  Dispense: 90 tablet; Refill: 1  4. Rheumatoid arthritis involving multiple sites with positive rheumatoid factor (HCC)   Continue current medications  Health Maintenance reviewed Diet and exercise  encouraged  Follow up plan: 2 months to recheck thyroid    Jannifer Rodney, FNP

## 2023-05-30 ENCOUNTER — Ambulatory Visit (INDEPENDENT_AMBULATORY_CARE_PROVIDER_SITE_OTHER): Payer: Medicare Other | Admitting: Family

## 2023-05-30 ENCOUNTER — Encounter: Payer: Self-pay | Admitting: Family

## 2023-05-30 VITALS — BP 120/70 | HR 74 | Temp 97.2°F | Ht <= 58 in | Wt 119.0 lb

## 2023-05-30 DIAGNOSIS — M0579 Rheumatoid arthritis with rheumatoid factor of multiple sites without organ or systems involvement: Secondary | ICD-10-CM

## 2023-05-30 DIAGNOSIS — F411 Generalized anxiety disorder: Secondary | ICD-10-CM

## 2023-05-30 DIAGNOSIS — D509 Iron deficiency anemia, unspecified: Secondary | ICD-10-CM

## 2023-05-30 DIAGNOSIS — I10 Essential (primary) hypertension: Secondary | ICD-10-CM | POA: Diagnosis not present

## 2023-05-30 DIAGNOSIS — E039 Hypothyroidism, unspecified: Secondary | ICD-10-CM | POA: Diagnosis not present
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
## 2023-05-30 NOTE — Patient Instructions (Signed)

## 2023-05-31 LAB — CMP14+EGFR
ALT: 15 [IU]/L (ref 0–32)
AST: 20 [IU]/L (ref 0–40)
Albumin: 4 g/dL (ref 3.7–4.7)
Alkaline Phosphatase: 89 [IU]/L (ref 44–121)
BUN/Creatinine Ratio: 23 (ref 12–28)
BUN: 24 mg/dL (ref 8–27)
Bilirubin Total: 0.3 mg/dL (ref 0.0–1.2)
CO2: 22 mmol/L (ref 20–29)
Calcium: 8.8 mg/dL (ref 8.7–10.3)
Chloride: 105 mmol/L (ref 96–106)
Creatinine, Ser: 1.06 mg/dL — ABNORMAL HIGH (ref 0.57–1.00)
Globulin, Total: 4.1 g/dL (ref 1.5–4.5)
Glucose: 71 mg/dL (ref 70–99)
Potassium: 4.3 mmol/L (ref 3.5–5.2)
Sodium: 144 mmol/L (ref 134–144)
Total Protein: 8.1 g/dL (ref 6.0–8.5)
eGFR: 51 mL/min/{1.73_m2} — ABNORMAL LOW (ref >59)

## 2023-05-31 LAB — IRON,TIBC AND FERRITIN PANEL
Ferritin: 148 ng/mL (ref 15–150)
Iron Saturation: 20 % (ref 15–55)
Iron: 68 ug/dL (ref 27–139)
Total Iron Binding Capacity: 336 ug/dL (ref 250–450)
UIBC: 268 ug/dL (ref 118–369)

## 2023-05-31 LAB — TSH: TSH: 13.4 u[IU]/mL — ABNORMAL HIGH (ref 0.450–4.500)

## 2023-06-07 ENCOUNTER — Ambulatory Visit: Payer: Medicare Other | Admitting: Family Medicine

## 2023-06-07 ENCOUNTER — Ambulatory Visit: Payer: Self-pay | Admitting: Family

## 2023-06-07 DIAGNOSIS — S0990XA Unspecified injury of head, initial encounter: Secondary | ICD-10-CM | POA: Diagnosis not present

## 2023-06-07 DIAGNOSIS — Z043 Encounter for examination and observation following other accident: Secondary | ICD-10-CM | POA: Diagnosis not present

## 2023-06-07 DIAGNOSIS — I672 Cerebral atherosclerosis: Secondary | ICD-10-CM | POA: Diagnosis not present

## 2023-06-07 DIAGNOSIS — Z79899 Other long term (current) drug therapy: Secondary | ICD-10-CM | POA: Diagnosis not present

## 2023-06-07 DIAGNOSIS — M25552 Pain in left hip: Secondary | ICD-10-CM | POA: Diagnosis not present

## 2023-06-07 DIAGNOSIS — M1990 Unspecified osteoarthritis, unspecified site: Secondary | ICD-10-CM | POA: Diagnosis not present

## 2023-06-07 DIAGNOSIS — E079 Disorder of thyroid, unspecified: Secondary | ICD-10-CM | POA: Diagnosis not present

## 2023-06-07 DIAGNOSIS — R935 Abnormal findings on diagnostic imaging of other abdominal regions, including retroperitoneum: Secondary | ICD-10-CM | POA: Diagnosis not present

## 2023-06-07 DIAGNOSIS — I10 Essential (primary) hypertension: Secondary | ICD-10-CM | POA: Diagnosis not present

## 2023-06-07 DIAGNOSIS — J3489 Other specified disorders of nose and nasal sinuses: Secondary | ICD-10-CM | POA: Diagnosis not present

## 2023-06-07 DIAGNOSIS — Z888 Allergy status to other drugs, medicaments and biological substances status: Secondary | ICD-10-CM | POA: Diagnosis not present

## 2023-06-07 DIAGNOSIS — I700 Atherosclerosis of aorta: Secondary | ICD-10-CM | POA: Diagnosis not present

## 2023-06-07 DIAGNOSIS — M25551 Pain in right hip: Secondary | ICD-10-CM | POA: Diagnosis not present

## 2023-06-07 DIAGNOSIS — E785 Hyperlipidemia, unspecified: Secondary | ICD-10-CM | POA: Diagnosis not present

## 2023-06-07 DIAGNOSIS — W1839XA Other fall on same level, initial encounter: Secondary | ICD-10-CM | POA: Diagnosis not present

## 2023-06-07 DIAGNOSIS — R519 Headache, unspecified: Secondary | ICD-10-CM | POA: Diagnosis not present

## 2023-06-07 NOTE — Telephone Encounter (Signed)
  Chief Complaint: fall Symptoms: knee, shoulder, and head pain Frequency: since 0230 Pertinent Negatives: Patient denies vision changes, denies N/V, CMS problems, denies blood thinner Disposition: [] ED /[] Urgent Care (no appt availability in office) / [x] Appointment(In office/virtual)/ []  [facility]/ [] Home Care/ [] Refused Recommended Disposition /[] [facility]/ []  Follow-up with PCP Additional Notes: Pt alert and orientated. States she tripped and fell. No LOC. + CMS. Scheduled appt for today.  Denies blood thinner use. Copied from CRM [id number]. Topic: Clinical - Red Word Triage >> Jun 07, 2023 12:54 PM Maxwell Marion wrote: Red Word that prompted transfer to Nurse Triage: fall Reason for Disposition . [1] MODERATE weakness (i.e., interferes with work, school, normal activities) AND [2] new-onset or worsening  Answer Assessment - Initial Assessment Questions 1. MECHANISM: "How did the fall happen?"     "Tripped" 2. DOMESTIC VIOLENCE AND ELDER ABUSE SCREENING: "Did you fall because someone pushed you or tried to hurt you?" If Yes, ask: "Are you safe now?"     Denies violence, lives alone 3. ONSET: "When did the fall happen?" (e.g., minutes, hours, or days ago)     0230am 4. LOCATION: "What part of the body hit the ground?" (e.g., back, buttocks, head, hips, knees, hands, head, stomach)     Face, knee, shoulder 6. PAIN: "Is there any pain?" If Yes, ask: "How bad is the pain?" (e.g., Scale 1-10; or mild,  moderate, severe)   - NONE (0): No pain   - MILD (1-3): Doesn't interfere with normal activities    - MODERATE (4-7): Interferes with normal activities or awakens from sleep    - SEVERE (8-10): Excruciating pain, unable to do any normal activities      6 7. SIZE: For cuts, bruises, or swelling, ask: "How large is it?" (e.g., inches or centimeters)      Bruised knee, nose, head 9. OTHER SYMPTOMS: "Do you have any other symptoms?" (e.g., dizziness, fever, weakness; new  onset or worsening).      denies 10. CAUSE: "What do you think caused the fall (or falling)?" (e.g., tripped, dizzy spell)       tripped  Protocols used: Falls and [facility]

## 2023-08-29 ENCOUNTER — Telehealth: Payer: Self-pay | Admitting: Family

## 2023-08-30 ENCOUNTER — Ambulatory Visit: Payer: Medicare Other | Admitting: Family

## 2023-09-17 ENCOUNTER — Ambulatory Visit: Payer: Medicare Other | Admitting: Family

## 2023-09-18 ENCOUNTER — Encounter: Payer: Self-pay | Admitting: Family

## 2023-09-27 ENCOUNTER — Encounter: Payer: Self-pay | Admitting: Family

## 2023-09-27 ENCOUNTER — Ambulatory Visit: Admitting: Family

## 2023-09-27 VITALS — BP 173/90 | HR 78 | Temp 97.3°F | Wt 117.6 lb

## 2023-09-27 DIAGNOSIS — I10 Essential (primary) hypertension: Secondary | ICD-10-CM | POA: Diagnosis not present

## 2023-09-27 DIAGNOSIS — E039 Hypothyroidism, unspecified: Secondary | ICD-10-CM

## 2023-09-27 DIAGNOSIS — D509 Iron deficiency anemia, unspecified: Secondary | ICD-10-CM

## 2023-09-27 DIAGNOSIS — E785 Hyperlipidemia, unspecified: Secondary | ICD-10-CM | POA: Diagnosis not present

## 2023-09-27 DIAGNOSIS — N3281 Overactive bladder: Secondary | ICD-10-CM

## 2023-09-27 DIAGNOSIS — M0579 Rheumatoid arthritis with rheumatoid factor of multiple sites without organ or systems involvement: Secondary | ICD-10-CM

## 2023-09-27 MED ORDER — LEVOTHYROXINE SODIUM 88 MCG PO TABS
88.0000 ug | ORAL_TABLET | Freq: Every day | ORAL | 1 refills | Status: DC
Start: 2023-09-27 — End: 2023-09-30

## 2023-09-27 NOTE — Patient Instructions (Signed)
 Hypertension, Adult High blood pressure (hypertension) is when the force of blood pumping through the arteries is too strong. The arteries are the blood vessels that carry blood from the heart throughout the body. Hypertension forces the heart to work harder to pump blood and may cause arteries to become narrow or stiff. Untreated or uncontrolled hypertension can lead to a heart attack, heart failure, a stroke, kidney disease, and other problems. A blood pressure reading consists of a higher number over a lower number. Ideally, your blood pressure should be below 120/80. The first ("top") number is called the systolic pressure. It is a measure of the pressure in your arteries as your heart beats. The second ("bottom") number is called the diastolic pressure. It is a measure of the pressure in your arteries as the heart relaxes. What are the causes? The exact cause of this condition is not known. There are some conditions that result in high blood pressure. What increases the risk? Certain factors may make you more likely to develop high blood pressure. Some of these risk factors are under your control, including: Smoking. Not getting enough exercise or physical activity. Being overweight. Having too much fat, sugar, calories, or salt (sodium) in your diet. Drinking too much alcohol. Other risk factors include: Having a personal history of heart disease, diabetes, high cholesterol, or kidney disease. Stress. Having a family history of high blood pressure and high cholesterol. Having obstructive sleep apnea. Age. The risk increases with age. What are the signs or symptoms? High blood pressure may not cause symptoms. Very high blood pressure (hypertensive crisis) may cause: Headache. Fast or irregular heartbeats (palpitations). Shortness of breath. Nosebleed. Nausea and vomiting. Vision changes. Severe chest pain, dizziness, and seizures. How is this diagnosed? This condition is diagnosed by  measuring your blood pressure while you are seated, with your arm resting on a flat surface, your legs uncrossed, and your feet flat on the floor. The cuff of the blood pressure monitor will be placed directly against the skin of your upper arm at the level of your heart. Blood pressure should be measured at least twice using the same arm. Certain conditions can cause a difference in blood pressure between your right and left arms. If you have a high blood pressure reading during one visit or you have normal blood pressure with other risk factors, you may be asked to: Return on a different day to have your blood pressure checked again. Monitor your blood pressure at home for 1 week or longer. If you are diagnosed with hypertension, you may have other blood or imaging tests to help your health care provider understand your overall risk for other conditions. How is this treated? This condition is treated by making healthy lifestyle changes, such as eating healthy foods, exercising more, and reducing your alcohol intake. You may be referred for counseling on a healthy diet and physical activity. Your health care provider may prescribe medicine if lifestyle changes are not enough to get your blood pressure under control and if: Your systolic blood pressure is above 130. Your diastolic blood pressure is above 80. Your personal target blood pressure may vary depending on your medical conditions, your age, and other factors. Follow these instructions at home: Eating and drinking  Eat a diet that is high in fiber and potassium, and low in sodium, added sugar, and fat. An example of this eating plan is called the DASH diet. DASH stands for Dietary Approaches to Stop Hypertension. To eat this way: Eat  plenty of fresh fruits and vegetables. Try to fill one half of your plate at each meal with fruits and vegetables. Eat whole grains, such as whole-wheat pasta, brown rice, or whole-grain bread. Fill about one  fourth of your plate with whole grains. Eat or drink low-fat dairy products, such as skim milk or low-fat yogurt. Avoid fatty cuts of meat, processed or cured meats, and poultry with skin. Fill about one fourth of your plate with lean proteins, such as fish, chicken without skin, beans, eggs, or tofu. Avoid pre-made and processed foods. These tend to be higher in sodium, added sugar, and fat. Reduce your daily sodium intake. Many people with hypertension should eat less than 1,500 mg of sodium a day. Do not drink alcohol if: Your health care provider tells you not to drink. You are pregnant, may be pregnant, or are planning to become pregnant. If you drink alcohol: Limit how much you have to: 0-1 drink a day for women. 0-2 drinks a day for men. Know how much alcohol is in your drink. In the [place] one drink equals one 12 oz bottle of beer (355 mL), one 5 oz glass of wine (148 mL), or one 1 oz glass of hard liquor (44 mL). Lifestyle  Work with your health care provider to maintain a healthy body weight or to lose weight. Ask what an ideal weight is for you. Get at least 30 minutes of exercise that causes your heart to beat faster (aerobic exercise) most days of the week. Activities may include walking, swimming, or biking. Include exercise to strengthen your muscles (resistance exercise), such as Pilates or lifting weights, as part of your weekly exercise routine. Try to do these types of exercises for 30 minutes at least 3 days a week. Do not use any products that contain nicotine or tobacco. These products include cigarettes, chewing tobacco, and vaping devices, such as e-cigarettes. If you need help quitting, ask your health care provider. Monitor your blood pressure at home as told by your health care provider. Keep all follow-up visits. This is important. Medicines Take over-the-counter and prescription medicines only as told by your health care provider. Follow directions carefully. Blood  pressure medicines must be taken as prescribed. Do not skip doses of blood pressure medicine. Doing this puts you at risk for problems and can make the medicine less effective. Ask your health care provider about side effects or reactions to medicines that you should watch for. Contact a health care provider if you: Think you are having a reaction to a medicine you are taking. Have headaches that keep coming back (recurring). Feel dizzy. Have swelling in your ankles. Have trouble with your vision. Get help right away if you: Develop a severe headache or confusion. Have unusual weakness or numbness. Feel faint. Have severe pain in your chest or abdomen. Vomit repeatedly. Have trouble breathing. These symptoms may be an emergency. Get help right away. Call 911. Do not wait to see if the symptoms will go away. Do not drive yourself to the hospital. Summary Hypertension is when the force of blood pumping through your arteries is too strong. If this condition is not controlled, it may put you at risk for serious complications. Your personal target blood pressure may vary depending on your medical conditions, your age, and other factors. For most people, a normal blood pressure is less than 120/80. Hypertension is treated with lifestyle changes, medicines, or a combination of both. Lifestyle changes include losing weight, eating a healthy,  low-sodium diet, exercising more, and limiting alcohol. This information is not intended to replace advice given to you by your health care provider. Make sure you discuss any questions you have with your health care provider. Document Revised: 05/09/2021 Document Reviewed: 05/09/2021 Elsevier Patient Education  2024 [employer].

## 2023-09-27 NOTE — Progress Notes (Signed)
 Subjective:    Patient ID: Ashley Nelson, female    DOB: 1933-06-07, 89 y.o.   MRN: [id number]  Chief Complaint  Patient presents with   Medical Management of Chronic Issues   PT presents to the office today for chronic follow up.   She has RA and has not seen a Rheumatologists. States her pain is 8 out 10. She stopped her Arava 10 mg because she felt like this was not working.  Arthritis Presents for follow-up visit. She complains of pain and stiffness. Affected locations include the left knee, right knee, left MCP, right MCP, left foot and right foot. Her pain is at a severity of 8/10. Associated symptoms include fatigue. Pertinent negatives include no diarrhea.  Hypertension This is a chronic problem. The current episode started more than 1 year ago. The problem has been waxing and waning since onset. The problem is uncontrolled. Associated symptoms include malaise/fatigue and peripheral edema. Pertinent negatives include no shortness of breath. Risk factors for coronary artery disease include dyslipidemia and sedentary lifestyle. Past treatments include diuretics. The current treatment provides moderate improvement. Identifiable causes of hypertension include a thyroid problem.  Thyroid Problem Presents for follow-up visit. Symptoms include anxiety, constipation and fatigue. Patient reports no diaphoresis or diarrhea. The symptoms have been stable. Her past medical history is significant for hyperlipidemia.  Anemia Presents for follow-up visit. Symptoms include malaise/fatigue.  Hyperlipidemia This is a chronic problem. The current episode started more than 1 year ago. The problem is uncontrolled. Recent lipid tests were reviewed and are high. Pertinent negatives include no shortness of breath. The current treatment provides mild improvement of lipids. Risk factors for coronary artery disease include dyslipidemia, hypertension and a sedentary lifestyle.  Urinary Frequency  This is a  chronic problem. The current episode started more than 1 year ago. The problem occurs intermittently. The problem has been waxing and waning. The patient is experiencing no pain. Associated symptoms include frequency. Treatments tried: myrbetriq. The treatment provided mild relief. There is no history of a urological procedure.      Review of Systems  Constitutional:  Positive for fatigue and malaise/fatigue. Negative for diaphoresis.  Respiratory:  Negative for shortness of breath.   Gastrointestinal:  Positive for constipation. Negative for diarrhea.  Genitourinary:  Positive for frequency.  Musculoskeletal:  Positive for stiffness.  Psychiatric/Behavioral:  The patient is nervous/anxious.   All other systems reviewed and are negative.      Objective:   Physical Exam Vitals reviewed.  Constitutional:      General: She is not in acute distress.    Appearance: She is well-developed.  HENT:     Head: Normocephalic and atraumatic.     Right Ear: Tympanic membrane normal.     Left Ear: Tympanic membrane normal.  Eyes:     Pupils: Pupils are equal, round, and reactive to light.  Neck:     Thyroid: No thyromegaly.  Cardiovascular:     Rate and Rhythm: Normal rate and regular rhythm.     Heart sounds: Normal heart sounds. No murmur heard. Pulmonary:     Effort: Pulmonary effort is normal. No respiratory distress.     Breath sounds: Normal breath sounds. No wheezing.  Abdominal:     General: Bowel sounds are normal. There is no distension.     Palpations: Abdomen is soft.     Tenderness: There is no abdominal tenderness.  Musculoskeletal:        General: No tenderness. Normal range of motion.  Cervical back: Normal range of motion and neck supple.     Right lower leg: Edema (2+) present.     Left lower leg: Edema (2+) present.     Comments: Pain in left shoulder with abduction , second left toe is curled over great toe  Skin:    General: Skin is warm and dry.   Neurological:     Mental Status: She is alert and oriented to person, place, and time.     Cranial Nerves: No cranial nerve deficit.     Deep Tendon Reflexes: Reflexes are normal and symmetric.  Psychiatric:        Behavior: Behavior normal.        Thought Content: Thought content normal.        Judgment: Judgment normal.       BP (!) 187/81   Pulse 78   Temp (!) 97.3 F (36.3 C) (Temporal)   Wt 117 lb 9.6 oz (53.3 kg)   SpO2 96%   BMI 24.58 kg/m      Assessment & Plan:  Ashley Nelson comes in today with chief complaint of Medical Management of Chronic Issues   Diagnosis and orders addressed: 1. Hypothyroidism, unspecified type (Primary) - CMP14+EGFR - CBC with Differential/Platelet - TSH - levothyroxine (SYNTHROID) 88 MCG tablet; Take 1 tablet (88 mcg total) by mouth daily.  Dispense: 90 tablet; Refill: 1  2. Essential hypertension - CMP14+EGFR - CBC with Differential/Platelet  3. Rheumatoid arthritis involving multiple sites with positive rheumatoid factor (HCC) - CMP14+EGFR - CBC with Differential/Platelet - Ambulatory referral to Podiatry  4. Hyperlipidemia, unspecified hyperlipidemia type - CMP14+EGFR - CBC with Differential/Platelet  5. Overactive bladder - CMP14+EGFR - CBC with Differential/Platelet  6. Iron deficiency anemia, unspecified iron deficiency anemia type - CMP14+EGFR - CBC with Differential/Platelet    Labs pending Pt has not taken any medications this morning. She will follow up in 2 weeks and take hydrochlorothiazide 12.5 mg prior to her visit.  Referral to Podiatry pending  Health Maintenance reviewed Diet and exercise encouraged  Follow up plan: 2 weeks    Jannifer Rodney, FNP

## 2023-09-28 LAB — CMP14+EGFR
ALT: 10 IU/L (ref 0–32)
AST: 25 IU/L (ref 0–40)
Albumin: 4.1 g/dL (ref 3.7–4.7)
Alkaline Phosphatase: 86 IU/L (ref 44–121)
BUN/Creatinine Ratio: 19 (ref 12–28)
BUN: 15 mg/dL (ref 8–27)
Bilirubin Total: 0.4 mg/dL (ref 0.0–1.2)
CO2: 24 mmol/L (ref 20–29)
Calcium: 9 mg/dL (ref 8.7–10.3)
Chloride: 101 mmol/L (ref 96–106)
Creatinine, Ser: 0.8 mg/dL (ref 0.57–1.00)
Globulin, Total: 4.2 g/dL (ref 1.5–4.5)
Glucose: 84 mg/dL (ref 70–99)
Potassium: 4 mmol/L (ref 3.5–5.2)
Sodium: 140 mmol/L (ref 134–144)
Total Protein: 8.3 g/dL (ref 6.0–8.5)
eGFR: 71 mL/min/{1.73_m2} (ref >59)

## 2023-09-28 LAB — CBC WITH DIFFERENTIAL/PLATELET
Basophils Absolute: 0.1 10*3/uL (ref 0.0–0.2)
Basos: 1 %
EOS (ABSOLUTE): 0.4 10*3/uL (ref 0.0–0.4)
Eos: 5 %
Hematocrit: 36.5 % (ref 34.0–46.6)
Hemoglobin: 12.2 g/dL (ref 11.1–15.9)
Immature Grans (Abs): 0 10*3/uL (ref 0.0–0.1)
Immature Granulocytes: 0 %
Lymphocytes Absolute: 1.9 10*3/uL (ref 0.7–3.1)
Lymphs: 27 %
MCH: 31.8 pg (ref 26.6–33.0)
MCHC: 33.4 g/dL (ref 31.5–35.7)
MCV: 95 fL (ref 79–97)
Monocytes Absolute: 0.6 10*3/uL (ref 0.1–0.9)
Monocytes: 9 %
Neutrophils Absolute: 4 10*3/uL (ref 1.4–7.0)
Neutrophils: 58 %
Platelets: 234 10*3/uL (ref 150–450)
RBC: 3.84 x10E6/uL (ref 3.77–5.28)
RDW: 13.3 % (ref 11.7–15.4)
WBC: 6.9 10*3/uL (ref 3.4–10.8)

## 2023-09-28 LAB — TSH: TSH: 13.4 u[IU]/mL — ABNORMAL HIGH (ref 0.450–4.500)

## 2023-09-30 ENCOUNTER — Other Ambulatory Visit: Payer: Self-pay | Admitting: Family

## 2023-09-30 MED ORDER — LEVOTHYROXINE SODIUM 100 MCG PO TABS: 100.0000 ug | ORAL_TABLET | Freq: Every day | ORAL | 1 refills | Status: AC

## 2023-12-02 ENCOUNTER — Ambulatory Visit: Admitting: Family

## 2023-12-02 ENCOUNTER — Encounter: Payer: Self-pay | Admitting: Family

## 2024-04-25 DIAGNOSIS — K6389 Other specified diseases of intestine: Secondary | ICD-10-CM | POA: Diagnosis not present

## 2024-04-25 DIAGNOSIS — K8020 Calculus of gallbladder without cholecystitis without obstruction: Secondary | ICD-10-CM | POA: Diagnosis not present

## 2024-04-25 DIAGNOSIS — K529 Noninfective gastroenteritis and colitis, unspecified: Secondary | ICD-10-CM | POA: Diagnosis not present

## 2024-04-25 DIAGNOSIS — M1990 Unspecified osteoarthritis, unspecified site: Secondary | ICD-10-CM | POA: Diagnosis not present

## 2024-04-25 DIAGNOSIS — K625 Hemorrhage of anus and rectum: Secondary | ICD-10-CM | POA: Diagnosis not present

## 2024-04-25 DIAGNOSIS — R42 Dizziness and giddiness: Secondary | ICD-10-CM | POA: Diagnosis not present

## 2024-04-25 DIAGNOSIS — I10 Essential (primary) hypertension: Secondary | ICD-10-CM | POA: Diagnosis not present

## 2024-04-26 DIAGNOSIS — E785 Hyperlipidemia, unspecified: Secondary | ICD-10-CM | POA: Diagnosis not present

## 2024-04-26 DIAGNOSIS — K648 Other hemorrhoids: Secondary | ICD-10-CM | POA: Diagnosis not present

## 2024-04-26 DIAGNOSIS — M06842 Other specified rheumatoid arthritis, left hand: Secondary | ICD-10-CM | POA: Diagnosis not present

## 2024-04-26 DIAGNOSIS — Z79899 Other long term (current) drug therapy: Secondary | ICD-10-CM | POA: Diagnosis not present

## 2024-04-26 DIAGNOSIS — K559 Vascular disorder of intestine, unspecified: Secondary | ICD-10-CM | POA: Diagnosis not present

## 2024-04-26 DIAGNOSIS — K922 Gastrointestinal hemorrhage, unspecified: Secondary | ICD-10-CM | POA: Diagnosis not present

## 2024-04-26 DIAGNOSIS — R9389 Abnormal findings on diagnostic imaging of other specified body structures: Secondary | ICD-10-CM | POA: Diagnosis not present

## 2024-04-26 DIAGNOSIS — E876 Hypokalemia: Secondary | ICD-10-CM | POA: Diagnosis not present

## 2024-04-26 DIAGNOSIS — E039 Hypothyroidism, unspecified: Secondary | ICD-10-CM | POA: Diagnosis not present

## 2024-04-26 DIAGNOSIS — M06841 Other specified rheumatoid arthritis, right hand: Secondary | ICD-10-CM | POA: Diagnosis not present

## 2024-04-26 DIAGNOSIS — K921 Melena: Secondary | ICD-10-CM | POA: Diagnosis not present

## 2024-04-26 DIAGNOSIS — Z87891 Personal history of nicotine dependence: Secondary | ICD-10-CM | POA: Diagnosis not present

## 2024-04-26 DIAGNOSIS — D120 Benign neoplasm of cecum: Secondary | ICD-10-CM | POA: Diagnosis not present

## 2024-04-26 DIAGNOSIS — I10 Essential (primary) hypertension: Secondary | ICD-10-CM | POA: Diagnosis not present

## 2024-04-27 DIAGNOSIS — K559 Vascular disorder of intestine, unspecified: Secondary | ICD-10-CM | POA: Diagnosis not present

## 2024-04-27 DIAGNOSIS — I10 Essential (primary) hypertension: Secondary | ICD-10-CM | POA: Diagnosis not present

## 2024-04-27 DIAGNOSIS — E876 Hypokalemia: Secondary | ICD-10-CM | POA: Diagnosis not present

## 2024-04-27 DIAGNOSIS — K922 Gastrointestinal hemorrhage, unspecified: Secondary | ICD-10-CM | POA: Diagnosis not present

## 2024-04-27 DIAGNOSIS — E039 Hypothyroidism, unspecified: Secondary | ICD-10-CM | POA: Diagnosis not present

## 2024-04-27 DIAGNOSIS — M06842 Other specified rheumatoid arthritis, left hand: Secondary | ICD-10-CM | POA: Diagnosis not present

## 2024-04-27 DIAGNOSIS — D120 Benign neoplasm of cecum: Secondary | ICD-10-CM | POA: Diagnosis not present

## 2024-04-27 DIAGNOSIS — M06841 Other specified rheumatoid arthritis, right hand: Secondary | ICD-10-CM | POA: Diagnosis not present

## 2024-04-27 DIAGNOSIS — E785 Hyperlipidemia, unspecified: Secondary | ICD-10-CM | POA: Diagnosis not present

## 2024-04-27 DIAGNOSIS — R9389 Abnormal findings on diagnostic imaging of other specified body structures: Secondary | ICD-10-CM | POA: Diagnosis not present

## 2024-04-27 DIAGNOSIS — Z79899 Other long term (current) drug therapy: Secondary | ICD-10-CM | POA: Diagnosis not present

## 2024-04-27 DIAGNOSIS — Z87891 Personal history of nicotine dependence: Secondary | ICD-10-CM | POA: Diagnosis not present

## 2024-04-27 DIAGNOSIS — K921 Melena: Secondary | ICD-10-CM | POA: Diagnosis not present

## 2024-04-27 DIAGNOSIS — K648 Other hemorrhoids: Secondary | ICD-10-CM | POA: Diagnosis not present

## 2024-04-28 DIAGNOSIS — Z87891 Personal history of nicotine dependence: Secondary | ICD-10-CM | POA: Diagnosis not present

## 2024-04-28 DIAGNOSIS — Z79899 Other long term (current) drug therapy: Secondary | ICD-10-CM | POA: Diagnosis not present

## 2024-04-28 DIAGNOSIS — K648 Other hemorrhoids: Secondary | ICD-10-CM | POA: Diagnosis not present

## 2024-04-28 DIAGNOSIS — R9389 Abnormal findings on diagnostic imaging of other specified body structures: Secondary | ICD-10-CM | POA: Diagnosis not present

## 2024-04-28 DIAGNOSIS — E785 Hyperlipidemia, unspecified: Secondary | ICD-10-CM | POA: Diagnosis not present

## 2024-04-28 DIAGNOSIS — K625 Hemorrhage of anus and rectum: Secondary | ICD-10-CM | POA: Diagnosis not present

## 2024-04-28 DIAGNOSIS — K921 Melena: Secondary | ICD-10-CM | POA: Diagnosis not present

## 2024-04-28 DIAGNOSIS — D120 Benign neoplasm of cecum: Secondary | ICD-10-CM | POA: Diagnosis not present

## 2024-04-28 DIAGNOSIS — E039 Hypothyroidism, unspecified: Secondary | ICD-10-CM | POA: Diagnosis not present

## 2024-04-28 DIAGNOSIS — K559 Vascular disorder of intestine, unspecified: Secondary | ICD-10-CM | POA: Diagnosis not present

## 2024-04-28 DIAGNOSIS — I10 Essential (primary) hypertension: Secondary | ICD-10-CM | POA: Diagnosis not present

## 2024-04-28 DIAGNOSIS — K922 Gastrointestinal hemorrhage, unspecified: Secondary | ICD-10-CM | POA: Diagnosis not present

## 2024-04-28 DIAGNOSIS — K51911 Ulcerative colitis, unspecified with rectal bleeding: Secondary | ICD-10-CM | POA: Diagnosis not present

## 2024-04-28 DIAGNOSIS — M06842 Other specified rheumatoid arthritis, left hand: Secondary | ICD-10-CM | POA: Diagnosis not present

## 2024-04-28 DIAGNOSIS — M06841 Other specified rheumatoid arthritis, right hand: Secondary | ICD-10-CM | POA: Diagnosis not present

## 2024-04-28 DIAGNOSIS — E876 Hypokalemia: Secondary | ICD-10-CM | POA: Diagnosis not present

## 2024-04-28 LAB — HM COLONOSCOPY

## 2024-04-29 DIAGNOSIS — D120 Benign neoplasm of cecum: Secondary | ICD-10-CM | POA: Diagnosis not present

## 2024-04-29 DIAGNOSIS — K922 Gastrointestinal hemorrhage, unspecified: Secondary | ICD-10-CM | POA: Diagnosis not present

## 2024-04-29 DIAGNOSIS — M06841 Other specified rheumatoid arthritis, right hand: Secondary | ICD-10-CM | POA: Diagnosis not present

## 2024-04-29 DIAGNOSIS — K559 Vascular disorder of intestine, unspecified: Secondary | ICD-10-CM | POA: Diagnosis not present

## 2024-04-29 DIAGNOSIS — E876 Hypokalemia: Secondary | ICD-10-CM | POA: Diagnosis not present

## 2024-04-29 DIAGNOSIS — E785 Hyperlipidemia, unspecified: Secondary | ICD-10-CM | POA: Diagnosis not present

## 2024-04-29 DIAGNOSIS — K921 Melena: Secondary | ICD-10-CM | POA: Diagnosis not present

## 2024-04-29 DIAGNOSIS — Z79899 Other long term (current) drug therapy: Secondary | ICD-10-CM | POA: Diagnosis not present

## 2024-04-29 DIAGNOSIS — Z87891 Personal history of nicotine dependence: Secondary | ICD-10-CM | POA: Diagnosis not present

## 2024-04-29 DIAGNOSIS — E039 Hypothyroidism, unspecified: Secondary | ICD-10-CM | POA: Diagnosis not present

## 2024-04-29 DIAGNOSIS — I10 Essential (primary) hypertension: Secondary | ICD-10-CM | POA: Diagnosis not present

## 2024-04-29 DIAGNOSIS — M06842 Other specified rheumatoid arthritis, left hand: Secondary | ICD-10-CM | POA: Diagnosis not present

## 2024-04-29 DIAGNOSIS — K648 Other hemorrhoids: Secondary | ICD-10-CM | POA: Diagnosis not present

## 2024-04-29 DIAGNOSIS — R9389 Abnormal findings on diagnostic imaging of other specified body structures: Secondary | ICD-10-CM | POA: Diagnosis not present

## 2024-04-30 DIAGNOSIS — R9389 Abnormal findings on diagnostic imaging of other specified body structures: Secondary | ICD-10-CM | POA: Diagnosis not present

## 2024-04-30 DIAGNOSIS — Z87891 Personal history of nicotine dependence: Secondary | ICD-10-CM | POA: Diagnosis not present

## 2024-04-30 DIAGNOSIS — K922 Gastrointestinal hemorrhage, unspecified: Secondary | ICD-10-CM | POA: Diagnosis not present

## 2024-04-30 DIAGNOSIS — M06842 Other specified rheumatoid arthritis, left hand: Secondary | ICD-10-CM | POA: Diagnosis not present

## 2024-04-30 DIAGNOSIS — K648 Other hemorrhoids: Secondary | ICD-10-CM | POA: Diagnosis not present

## 2024-04-30 DIAGNOSIS — K921 Melena: Secondary | ICD-10-CM | POA: Diagnosis not present

## 2024-04-30 DIAGNOSIS — E039 Hypothyroidism, unspecified: Secondary | ICD-10-CM | POA: Diagnosis not present

## 2024-04-30 DIAGNOSIS — E876 Hypokalemia: Secondary | ICD-10-CM | POA: Diagnosis not present

## 2024-04-30 DIAGNOSIS — K559 Vascular disorder of intestine, unspecified: Secondary | ICD-10-CM | POA: Diagnosis not present

## 2024-04-30 DIAGNOSIS — I10 Essential (primary) hypertension: Secondary | ICD-10-CM | POA: Diagnosis not present

## 2024-04-30 DIAGNOSIS — D120 Benign neoplasm of cecum: Secondary | ICD-10-CM | POA: Diagnosis not present

## 2024-04-30 DIAGNOSIS — Z79899 Other long term (current) drug therapy: Secondary | ICD-10-CM | POA: Diagnosis not present

## 2024-04-30 DIAGNOSIS — E785 Hyperlipidemia, unspecified: Secondary | ICD-10-CM | POA: Diagnosis not present

## 2024-04-30 DIAGNOSIS — M06841 Other specified rheumatoid arthritis, right hand: Secondary | ICD-10-CM | POA: Diagnosis not present

## 2024-05-01 DIAGNOSIS — M06842 Other specified rheumatoid arthritis, left hand: Secondary | ICD-10-CM | POA: Diagnosis not present

## 2024-05-01 DIAGNOSIS — K922 Gastrointestinal hemorrhage, unspecified: Secondary | ICD-10-CM | POA: Diagnosis not present

## 2024-05-01 DIAGNOSIS — Z79899 Other long term (current) drug therapy: Secondary | ICD-10-CM | POA: Diagnosis not present

## 2024-05-01 DIAGNOSIS — K559 Vascular disorder of intestine, unspecified: Secondary | ICD-10-CM | POA: Diagnosis not present

## 2024-05-01 DIAGNOSIS — R9389 Abnormal findings on diagnostic imaging of other specified body structures: Secondary | ICD-10-CM | POA: Diagnosis not present

## 2024-05-01 DIAGNOSIS — E785 Hyperlipidemia, unspecified: Secondary | ICD-10-CM | POA: Diagnosis not present

## 2024-05-01 DIAGNOSIS — D120 Benign neoplasm of cecum: Secondary | ICD-10-CM | POA: Diagnosis not present

## 2024-05-01 DIAGNOSIS — E876 Hypokalemia: Secondary | ICD-10-CM | POA: Diagnosis not present

## 2024-05-01 DIAGNOSIS — Z87891 Personal history of nicotine dependence: Secondary | ICD-10-CM | POA: Diagnosis not present

## 2024-05-01 DIAGNOSIS — K921 Melena: Secondary | ICD-10-CM | POA: Diagnosis not present

## 2024-05-01 DIAGNOSIS — E039 Hypothyroidism, unspecified: Secondary | ICD-10-CM | POA: Diagnosis not present

## 2024-05-01 DIAGNOSIS — K648 Other hemorrhoids: Secondary | ICD-10-CM | POA: Diagnosis not present

## 2024-05-01 DIAGNOSIS — I10 Essential (primary) hypertension: Secondary | ICD-10-CM | POA: Diagnosis not present

## 2024-05-01 DIAGNOSIS — M06841 Other specified rheumatoid arthritis, right hand: Secondary | ICD-10-CM | POA: Diagnosis not present

## 2024-05-02 DIAGNOSIS — E785 Hyperlipidemia, unspecified: Secondary | ICD-10-CM | POA: Diagnosis not present

## 2024-05-02 DIAGNOSIS — Z87891 Personal history of nicotine dependence: Secondary | ICD-10-CM | POA: Diagnosis not present

## 2024-05-02 DIAGNOSIS — K648 Other hemorrhoids: Secondary | ICD-10-CM | POA: Diagnosis not present

## 2024-05-02 DIAGNOSIS — K559 Vascular disorder of intestine, unspecified: Secondary | ICD-10-CM | POA: Diagnosis not present

## 2024-05-02 DIAGNOSIS — E039 Hypothyroidism, unspecified: Secondary | ICD-10-CM | POA: Diagnosis not present

## 2024-05-02 DIAGNOSIS — R9389 Abnormal findings on diagnostic imaging of other specified body structures: Secondary | ICD-10-CM | POA: Diagnosis not present

## 2024-05-02 DIAGNOSIS — Z79899 Other long term (current) drug therapy: Secondary | ICD-10-CM | POA: Diagnosis not present

## 2024-05-02 DIAGNOSIS — M06842 Other specified rheumatoid arthritis, left hand: Secondary | ICD-10-CM | POA: Diagnosis not present

## 2024-05-02 DIAGNOSIS — K921 Melena: Secondary | ICD-10-CM | POA: Diagnosis not present

## 2024-05-02 DIAGNOSIS — D120 Benign neoplasm of cecum: Secondary | ICD-10-CM | POA: Diagnosis not present

## 2024-05-02 DIAGNOSIS — I10 Essential (primary) hypertension: Secondary | ICD-10-CM | POA: Diagnosis not present

## 2024-05-02 DIAGNOSIS — K922 Gastrointestinal hemorrhage, unspecified: Secondary | ICD-10-CM | POA: Diagnosis not present

## 2024-05-02 DIAGNOSIS — M06841 Other specified rheumatoid arthritis, right hand: Secondary | ICD-10-CM | POA: Diagnosis not present

## 2024-05-02 DIAGNOSIS — E876 Hypokalemia: Secondary | ICD-10-CM | POA: Diagnosis not present

## 2024-05-03 DIAGNOSIS — D120 Benign neoplasm of cecum: Secondary | ICD-10-CM | POA: Diagnosis not present

## 2024-05-03 DIAGNOSIS — R9389 Abnormal findings on diagnostic imaging of other specified body structures: Secondary | ICD-10-CM | POA: Diagnosis not present

## 2024-05-03 DIAGNOSIS — K648 Other hemorrhoids: Secondary | ICD-10-CM | POA: Diagnosis not present

## 2024-05-03 DIAGNOSIS — E039 Hypothyroidism, unspecified: Secondary | ICD-10-CM | POA: Diagnosis not present

## 2024-05-03 DIAGNOSIS — Z87891 Personal history of nicotine dependence: Secondary | ICD-10-CM | POA: Diagnosis not present

## 2024-05-03 DIAGNOSIS — M06841 Other specified rheumatoid arthritis, right hand: Secondary | ICD-10-CM | POA: Diagnosis not present

## 2024-05-03 DIAGNOSIS — E785 Hyperlipidemia, unspecified: Secondary | ICD-10-CM | POA: Diagnosis not present

## 2024-05-03 DIAGNOSIS — K922 Gastrointestinal hemorrhage, unspecified: Secondary | ICD-10-CM | POA: Diagnosis not present

## 2024-05-03 DIAGNOSIS — K921 Melena: Secondary | ICD-10-CM | POA: Diagnosis not present

## 2024-05-03 DIAGNOSIS — K559 Vascular disorder of intestine, unspecified: Secondary | ICD-10-CM | POA: Diagnosis not present

## 2024-05-03 DIAGNOSIS — Z79899 Other long term (current) drug therapy: Secondary | ICD-10-CM | POA: Diagnosis not present

## 2024-05-03 DIAGNOSIS — I10 Essential (primary) hypertension: Secondary | ICD-10-CM | POA: Diagnosis not present

## 2024-05-03 DIAGNOSIS — M06842 Other specified rheumatoid arthritis, left hand: Secondary | ICD-10-CM | POA: Diagnosis not present

## 2024-05-03 DIAGNOSIS — E876 Hypokalemia: Secondary | ICD-10-CM | POA: Diagnosis not present

## 2024-05-04 DIAGNOSIS — K921 Melena: Secondary | ICD-10-CM | POA: Diagnosis not present

## 2024-05-04 DIAGNOSIS — K648 Other hemorrhoids: Secondary | ICD-10-CM | POA: Diagnosis not present

## 2024-05-04 DIAGNOSIS — K922 Gastrointestinal hemorrhage, unspecified: Secondary | ICD-10-CM | POA: Diagnosis not present

## 2024-05-04 DIAGNOSIS — Z87891 Personal history of nicotine dependence: Secondary | ICD-10-CM | POA: Diagnosis not present

## 2024-05-04 DIAGNOSIS — I10 Essential (primary) hypertension: Secondary | ICD-10-CM | POA: Diagnosis not present

## 2024-05-04 DIAGNOSIS — E785 Hyperlipidemia, unspecified: Secondary | ICD-10-CM | POA: Diagnosis not present

## 2024-05-04 DIAGNOSIS — M06841 Other specified rheumatoid arthritis, right hand: Secondary | ICD-10-CM | POA: Diagnosis not present

## 2024-05-04 DIAGNOSIS — M06842 Other specified rheumatoid arthritis, left hand: Secondary | ICD-10-CM | POA: Diagnosis not present

## 2024-05-04 DIAGNOSIS — E039 Hypothyroidism, unspecified: Secondary | ICD-10-CM | POA: Diagnosis not present

## 2024-05-04 DIAGNOSIS — K559 Vascular disorder of intestine, unspecified: Secondary | ICD-10-CM | POA: Diagnosis not present

## 2024-05-04 DIAGNOSIS — E876 Hypokalemia: Secondary | ICD-10-CM | POA: Diagnosis not present

## 2024-05-04 DIAGNOSIS — Z79899 Other long term (current) drug therapy: Secondary | ICD-10-CM | POA: Diagnosis not present

## 2024-05-04 DIAGNOSIS — D120 Benign neoplasm of cecum: Secondary | ICD-10-CM | POA: Diagnosis not present

## 2024-05-04 DIAGNOSIS — R9389 Abnormal findings on diagnostic imaging of other specified body structures: Secondary | ICD-10-CM | POA: Diagnosis not present

## 2024-05-05 DIAGNOSIS — K559 Vascular disorder of intestine, unspecified: Secondary | ICD-10-CM | POA: Diagnosis not present

## 2024-05-05 DIAGNOSIS — I10 Essential (primary) hypertension: Secondary | ICD-10-CM | POA: Diagnosis not present

## 2024-05-05 DIAGNOSIS — E785 Hyperlipidemia, unspecified: Secondary | ICD-10-CM | POA: Diagnosis not present

## 2024-05-05 DIAGNOSIS — K648 Other hemorrhoids: Secondary | ICD-10-CM | POA: Diagnosis not present

## 2024-05-05 DIAGNOSIS — K921 Melena: Secondary | ICD-10-CM | POA: Diagnosis not present

## 2024-05-05 DIAGNOSIS — K922 Gastrointestinal hemorrhage, unspecified: Secondary | ICD-10-CM | POA: Diagnosis not present

## 2024-05-05 DIAGNOSIS — M06842 Other specified rheumatoid arthritis, left hand: Secondary | ICD-10-CM | POA: Diagnosis not present

## 2024-05-05 DIAGNOSIS — M06841 Other specified rheumatoid arthritis, right hand: Secondary | ICD-10-CM | POA: Diagnosis not present

## 2024-05-05 DIAGNOSIS — D120 Benign neoplasm of cecum: Secondary | ICD-10-CM | POA: Diagnosis not present

## 2024-05-05 DIAGNOSIS — R9389 Abnormal findings on diagnostic imaging of other specified body structures: Secondary | ICD-10-CM | POA: Diagnosis not present

## 2024-05-05 DIAGNOSIS — E876 Hypokalemia: Secondary | ICD-10-CM | POA: Diagnosis not present

## 2024-05-05 DIAGNOSIS — Z79899 Other long term (current) drug therapy: Secondary | ICD-10-CM | POA: Diagnosis not present

## 2024-05-05 DIAGNOSIS — E039 Hypothyroidism, unspecified: Secondary | ICD-10-CM | POA: Diagnosis not present

## 2024-05-05 DIAGNOSIS — Z87891 Personal history of nicotine dependence: Secondary | ICD-10-CM | POA: Diagnosis not present

## 2024-05-08 DIAGNOSIS — E039 Hypothyroidism, unspecified: Secondary | ICD-10-CM | POA: Diagnosis not present

## 2024-05-08 DIAGNOSIS — K559 Vascular disorder of intestine, unspecified: Secondary | ICD-10-CM | POA: Diagnosis not present

## 2024-05-08 DIAGNOSIS — M06041 Rheumatoid arthritis without rheumatoid factor, right hand: Secondary | ICD-10-CM | POA: Diagnosis not present

## 2024-05-08 DIAGNOSIS — M06042 Rheumatoid arthritis without rheumatoid factor, left hand: Secondary | ICD-10-CM | POA: Diagnosis not present

## 2024-05-11 DIAGNOSIS — E559 Vitamin D deficiency, unspecified: Secondary | ICD-10-CM | POA: Diagnosis not present

## 2024-05-11 DIAGNOSIS — M06042 Rheumatoid arthritis without rheumatoid factor, left hand: Secondary | ICD-10-CM | POA: Diagnosis not present

## 2024-05-11 DIAGNOSIS — E039 Hypothyroidism, unspecified: Secondary | ICD-10-CM | POA: Diagnosis not present

## 2024-05-11 DIAGNOSIS — M06041 Rheumatoid arthritis without rheumatoid factor, right hand: Secondary | ICD-10-CM | POA: Diagnosis not present

## 2024-05-11 DIAGNOSIS — K559 Vascular disorder of intestine, unspecified: Secondary | ICD-10-CM | POA: Diagnosis not present

## 2024-05-19 ENCOUNTER — Telehealth: Payer: Self-pay

## 2024-05-19 NOTE — Telephone Encounter (Signed)
 Patient aware she is supposed to be on thyroid  medication. Aware to keep appt with pcp

## 2024-05-19 NOTE — Telephone Encounter (Signed)
 Copied from CRM #[id number]. Topic: Clinical - Medication Question >> May 19, 2024 11:51 AM Terri MATSU wrote: Reason for CRM: During patient stay at rehab she was taking a thyroid  pill , so they wanted to know if Dr.Hawks can prescribe her some. Callback number [employer] [phone]

## 2024-05-29 ENCOUNTER — Inpatient Hospital Stay: Admitting: Family

## 2024-06-01 ENCOUNTER — Telehealth: Payer: Self-pay | Admitting: Family Medicine

## 2024-06-01 NOTE — Telephone Encounter (Signed)
 Copied from CRM #[id number]. Topic: Clinical - Prescription Issue >> Jun 01, 2024  9:57 AM Cherylann RAMAN wrote: Reason for CRM: Caitlin with [facility] called to verify order for PT [phone] and OT [phone] were received. Order for PT was faxed on the 6th and order for OT was faxed on the 11th. For additional information please contact Caitlin at [phone].

## 2024-06-02 ENCOUNTER — Ambulatory Visit

## 2024-06-02 DIAGNOSIS — Z9079 Acquired absence of other genital organ(s): Secondary | ICD-10-CM

## 2024-06-02 DIAGNOSIS — K55039 Acute (reversible) ischemia of large intestine, extent unspecified: Secondary | ICD-10-CM

## 2024-06-02 DIAGNOSIS — I10 Essential (primary) hypertension: Secondary | ICD-10-CM | POA: Diagnosis not present

## 2024-06-02 DIAGNOSIS — M0579 Rheumatoid arthritis with rheumatoid factor of multiple sites without organ or systems involvement: Secondary | ICD-10-CM | POA: Diagnosis not present

## 2024-06-02 DIAGNOSIS — M152 Bouchard's nodes (with arthropathy): Secondary | ICD-10-CM | POA: Diagnosis not present

## 2024-06-02 DIAGNOSIS — E785 Hyperlipidemia, unspecified: Secondary | ICD-10-CM

## 2024-06-02 DIAGNOSIS — E034 Atrophy of thyroid (acquired): Secondary | ICD-10-CM

## 2024-06-02 DIAGNOSIS — Z79899 Other long term (current) drug therapy: Secondary | ICD-10-CM

## 2024-06-02 DIAGNOSIS — E559 Vitamin D deficiency, unspecified: Secondary | ICD-10-CM

## 2024-06-02 DIAGNOSIS — Z556 Problems related to health literacy: Secondary | ICD-10-CM

## 2024-06-02 DIAGNOSIS — N859 Noninflammatory disorder of uterus, unspecified: Secondary | ICD-10-CM

## 2024-06-02 NOTE — Telephone Encounter (Signed)
 TC back to [facility] w/ [facility], informed her that all ppw for PCP has been completed if she had not rcvd thse by last Fri then we had not rcvd them to sign. They actually had another provider in the practice assigned to the pt, they are updating their ppw and resending for pt's PCP to sign.
